# Patient Record
Sex: Female | Born: 1990 | Race: White | Hispanic: No | Marital: Single | Smoking: Current every day smoker
Health system: Southern US, Community
[De-identification: ages and names within clinical notes are randomized; demographics above are authoritative.]

## PROBLEM LIST (undated history)

## (undated) DIAGNOSIS — L732 Hidradenitis suppurativa: Secondary | ICD-10-CM

## (undated) DIAGNOSIS — T7840XA Allergy, unspecified, initial encounter: Secondary | ICD-10-CM

## (undated) HISTORY — DX: Allergy, unspecified, initial encounter: T78.40XA

## (undated) HISTORY — DX: Hidradenitis suppurativa: L73.2

---
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
## 2012-01-22 ENCOUNTER — Ambulatory Visit (INDEPENDENT_AMBULATORY_CARE_PROVIDER_SITE_OTHER): Payer: BC Managed Care – PPO | Admitting: Family Medicine

## 2012-01-22 VITALS — BP 91/62 | HR 114 | Temp 98.0°F | Resp 20 | Ht 66.5 in | Wt 137.4 lb

## 2012-01-22 DIAGNOSIS — R509 Fever, unspecified: Secondary | ICD-10-CM

## 2012-01-22 DIAGNOSIS — J029 Acute pharyngitis, unspecified: Secondary | ICD-10-CM

## 2012-01-22 DIAGNOSIS — R6889 Other general symptoms and signs: Secondary | ICD-10-CM

## 2012-01-22 DIAGNOSIS — J111 Influenza due to unidentified influenza virus with other respiratory manifestations: Secondary | ICD-10-CM

## 2012-01-22 LAB — POCT RAPID STREP A (OFFICE): Rapid Strep A Screen: NEGATIVE

## 2012-01-22 LAB — POCT INFLUENZA A/B
Influenza A, POC: NEGATIVE
Influenza B, POC: NEGATIVE

## 2012-01-22 MED ORDER — AMOXICILLIN 875 MG PO TABS: 875.0000 mg | ORAL_TABLET | Freq: Two times a day (BID) | ORAL | Status: DC

## 2012-01-22 NOTE — Progress Notes (Signed)
[facility]:  Office Visit  Chief Complaint:  Chief Complaint  Patient presents with  . Generalized Body Aches    3 days  . Headache  . Sore Throat  . Fever  . Cough    HPI: Jacqueline Kelly is a 21 y.o. female who complains of fevers Tmax 103 2 days ago, higherst yesterday 102.7 with msk aches, sore throat, cough, denies ear or facial pain, nausea and vomiting. + loose stools x 3 .  Friends have been sick. Worked at [employer]. No new travels, new foods, new medications.  Past Medical History  Diagnosis Date  . Allergy    History reviewed. No pertinent past surgical history. History   Social History  . Marital Status: Single    Spouse Name: N/A    Number of Children: N/A  . Years of Education: N/A   Social History Main Topics  . Smoking status: Never Smoker   . Smokeless tobacco: None  . Alcohol Use: No  . Drug Use: No  . Sexually Active: Yes    Birth Control/ Protection: None   Other Topics Concern  . None   Social History Narrative  . None   Family History  Problem Relation Age of Onset  . Heart disease Paternal Grandmother   . Cancer Paternal Grandfather    No Known Allergies Prior to Admission medications   Medication Sig Start Date End Date Taking? Authorizing Provider  naproxen (NAPROSYN) 500 MG tablet Take 1 tablet (500 mg total) by mouth 2 (two) times daily with a meal. As needed for pain 06/18/11 06/17/12  Celene Kras, MD     ROS: The patient denies night sweats, unintentional weight loss, chest pain, palpitations, wheezing, dyspnea on exertion,  abdominal pain, dysuria, hematuria, melena, numbness, weakness, or tingling.  All other systems have been reviewed and were otherwise negative with the exception of those mentioned in the HPI and as above.    PHYSICAL EXAM: Filed Vitals:   01/22/12 0933  BP: 91/62  Pulse: 114  Temp: 98 F (36.7 C)  Resp: 20   Filed Vitals:   01/22/12 0933  Height: 5' 6.5" (1.689 m)  Weight:  137 lb 6.4 oz (62.324 kg)   Body mass index is 21.84 kg/(m^2).  General: Alert, no acute distress HEENT:  Normocephalic, atraumatic, oropharynx patent. TM nl. + 3 left tonsil, no exudates. + erythema Cardiovascular:  Regular rate and rhythm, no rubs murmurs or gallops.  No Carotid bruits, radial pulse intact. No pedal edema.  Respiratory: Clear to auscultation bilaterally.  No wheezes, rales, or rhonchi.  No cyanosis, no use of accessory musculature GI: No organomegaly, abdomen is soft and non-tender, positive bowel sounds.  No masses. Skin: No rashes. Neurologic: Facial musculature symmetric. Psychiatric: Patient is appropriate throughout our interaction. Lymphatic: No cervical lymphadenopathy Musculoskeletal: Gait intact.   LABS: Results for orders placed in visit on 01/22/12  POCT RAPID STREP A (OFFICE)      Component Value Range   Rapid Strep A Screen Negative  Negative  POCT INFLUENZA A/B      Component Value Range   Influenza A, POC Negative     Influenza B, POC Negative       EKG/XRAY:   Primary read interpreted by Dr. Conley Rolls at [facility].   ASSESSMENT/PLAN: Encounter Diagnoses  Name Primary?  . Fever Yes  . Flu-like symptoms   . Pharyngitis    Bacterial vs viral URI, she has large red tonsils and  fever with cough I will go ahead an presumptively treat for bacterial phayrngitis/tonsillitis Rx Amoxacillin 875 mg BID x 10 days Salt water gargles Throat culture pending    LE, THAO PHUONG, DO 01/22/2012 10:34 AM

## 2012-01-24 LAB — CULTURE, GROUP A STREP: Organism ID, Bacteria: NORMAL

## 2012-02-07 ENCOUNTER — Encounter: Payer: Self-pay | Admitting: Family Medicine

## 2012-05-10 ENCOUNTER — Ambulatory Visit: Payer: BC Managed Care – PPO

## 2012-05-10 ENCOUNTER — Ambulatory Visit (INDEPENDENT_AMBULATORY_CARE_PROVIDER_SITE_OTHER): Payer: BC Managed Care – PPO | Admitting: Family Medicine

## 2012-05-10 VITALS — BP 100/68 | HR 90 | Temp 98.3°F | Resp 16 | Ht 66.25 in | Wt 139.0 lb

## 2012-05-10 DIAGNOSIS — N939 Abnormal uterine and vaginal bleeding, unspecified: Secondary | ICD-10-CM

## 2012-05-10 DIAGNOSIS — N926 Irregular menstruation, unspecified: Secondary | ICD-10-CM

## 2012-05-10 DIAGNOSIS — R319 Hematuria, unspecified: Secondary | ICD-10-CM

## 2012-05-10 DIAGNOSIS — R109 Unspecified abdominal pain: Secondary | ICD-10-CM

## 2012-05-10 DIAGNOSIS — Z7251 High risk heterosexual behavior: Secondary | ICD-10-CM

## 2012-05-10 LAB — POCT URINALYSIS DIPSTICK
Bilirubin, UA: NEGATIVE
Glucose, UA: NEGATIVE
Ketones, UA: NEGATIVE
Leukocytes, UA: NEGATIVE
Nitrite, UA: NEGATIVE
Protein, UA: 100
Spec Grav, UA: 1.025
Urobilinogen, UA: 1
pH, UA: 7

## 2012-05-10 LAB — POCT UA - MICROSCOPIC ONLY
Casts, Ur, LPF, POC: NEGATIVE
Crystals, Ur, HPF, POC: NEGATIVE
Mucus, UA: POSITIVE
Yeast, UA: NEGATIVE

## 2012-05-10 LAB — HIV ANTIBODY (ROUTINE TESTING W REFLEX): HIV: NONREACTIVE

## 2012-05-10 LAB — POCT CBC
Granulocyte percent: 71.7 %G (ref 37–80)
HCT, POC: 41.4 % (ref 37.7–47.9)
Hemoglobin: 13.1 g/dL (ref 12.2–16.2)
Lymph, poc: 2.2 (ref 0.6–3.4)
MCH, POC: 28.5 pg (ref 27–31.2)
MCHC: 31.6 g/dL — AB (ref 31.8–35.4)
MCV: 90.3 fL (ref 80–97)
MID (cbc): 0.5 (ref 0–0.9)
MPV: 10.5 fL (ref 0–99.8)
POC Granulocyte: 6.9 (ref 2–6.9)
POC LYMPH PERCENT: 22.9 %L (ref 10–50)
POC MID %: 5.4 %M (ref 0–12)
Platelet Count, POC: 30.7 10*3/uL — AB (ref 142–424)
RBC: 4.59 M/uL (ref 4.04–5.48)
RDW, POC: 14.3 %
WBC: 9.6 10*3/uL (ref 4.6–10.2)

## 2012-05-10 LAB — RPR

## 2012-05-10 LAB — POCT URINE PREGNANCY: Preg Test, Ur: NEGATIVE

## 2012-05-10 MED ORDER — CIPROFLOXACIN HCL 500 MG PO TABS: 500.0000 mg | ORAL_TABLET | Freq: Two times a day (BID) | ORAL | Status: DC

## 2012-05-10 MED ORDER — HYDROCODONE-ACETAMINOPHEN 5-325 MG PO TABS: 1.0000 | ORAL_TABLET | Freq: Four times a day (QID) | ORAL | Status: DC | PRN
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
## 2012-05-10 NOTE — Patient Instructions (Addendum)
Drink plenty of fluids, as this could be from a kidney stone, but we will also treat with an antibiotic for a possible urinary tract infection. You can take the pain medicine up to every 6 hours as needed, but this may make you sleepy - do not drive while taking this.  You need to be rechecked in the next 48 hours to determine if a cat scan is needed, make sure the bleeding is not abnormal vaginal bleeding, and to check for other sexually transmitted infections. Return to the clinic or go to the nearest emergency room if any of your symptoms worsen or new symptoms occur. Your should receive a call or letter about your lab results within the next week to 10 days.

## 2012-05-12 LAB — URINE CULTURE
Colony Count: NO GROWTH
Organism ID, Bacteria: NO GROWTH

## 2012-05-20 ENCOUNTER — Encounter: Payer: Self-pay | Admitting: Radiology

## 2012-07-15 ENCOUNTER — Ambulatory Visit (INDEPENDENT_AMBULATORY_CARE_PROVIDER_SITE_OTHER): Payer: BC Managed Care – PPO | Admitting: Internal Medicine

## 2012-07-15 VITALS — BP 130/90 | HR 125 | Temp 98.1°F | Resp 18 | Ht 66.0 in | Wt 135.0 lb

## 2012-07-15 DIAGNOSIS — R110 Nausea: Secondary | ICD-10-CM

## 2012-07-15 DIAGNOSIS — N946 Dysmenorrhea, unspecified: Secondary | ICD-10-CM

## 2012-07-15 DIAGNOSIS — N644 Mastodynia: Secondary | ICD-10-CM

## 2012-07-15 DIAGNOSIS — Z3009 Encounter for other general counseling and advice on contraception: Secondary | ICD-10-CM

## 2012-07-15 DIAGNOSIS — N912 Amenorrhea, unspecified: Secondary | ICD-10-CM

## 2012-07-15 DIAGNOSIS — R079 Chest pain, unspecified: Secondary | ICD-10-CM

## 2012-07-15 DIAGNOSIS — N390 Urinary tract infection, site not specified: Secondary | ICD-10-CM

## 2012-07-15 LAB — POCT CBC
Granulocyte percent: 67.2 %G (ref 37–80)
HCT, POC: 38.6 % (ref 37.7–47.9)
Hemoglobin: 12.1 g/dL — AB (ref 12.2–16.2)
Lymph, poc: 2.6 (ref 0.6–3.4)
MCH, POC: 27.8 pg (ref 27–31.2)
MCHC: 31.3 g/dL — AB (ref 31.8–35.4)
MCV: 88.6 fL (ref 80–97)
MID (cbc): 0.6 (ref 0–0.9)
MPV: 8.3 fL (ref 0–99.8)
POC Granulocyte: 6.5 (ref 2–6.9)
POC LYMPH PERCENT: 27.1 %L (ref 10–50)
POC MID %: 5.7 %M (ref 0–12)
Platelet Count, POC: 324 10*3/uL (ref 142–424)
RBC: 4.36 M/uL (ref 4.04–5.48)
RDW, POC: 14.3 %
WBC: 9.7 10*3/uL (ref 4.6–10.2)

## 2012-07-15 LAB — POCT UA - MICROSCOPIC ONLY
Amorphous: POSITIVE
Casts, Ur, LPF, POC: NEGATIVE
Crystals, Ur, HPF, POC: NEGATIVE
Mucus, UA: POSITIVE
Yeast, UA: NEGATIVE

## 2012-07-15 LAB — POCT URINALYSIS DIPSTICK
Bilirubin, UA: NEGATIVE
Glucose, UA: NEGATIVE
Ketones, UA: NEGATIVE
Nitrite, UA: NEGATIVE
Protein, UA: >=300
Spec Grav, UA: >=1.03
Urobilinogen, UA: 0.2
pH, UA: 7

## 2012-07-15 LAB — POCT URINE PREGNANCY: Preg Test, Ur: NEGATIVE

## 2012-07-15 MED ORDER — AMOXICILLIN 500 MG PO CAPS: 1000.0000 mg | ORAL_CAPSULE | Freq: Two times a day (BID) | ORAL | Status: DC

## 2012-07-15 MED ORDER — PRENATAL 27-0.8 MG PO TABS: 1.0000 | ORAL_TABLET | Freq: Every day | ORAL | Status: DC

## 2012-07-15 NOTE — Patient Instructions (Addendum)
Pregnancy If you are planning on getting pregnant, it is a good idea to make a preconception appointment with your caregiver to discuss having a healthy lifestyle before getting pregnant. This includes diet, weight, exercise, taking prenatal vitamins (especially folic acid, which helps prevent brain and spinal cord defects), avoiding alcohol, smoking and illegal drugs, medical problems (diabetes, convulsions), family history of genetic problems, working conditions, and immunizations. It is better to have knowledge of these things and do something about them before getting pregnant. During your pregnancy, it is important to follow certain guidelines in order to have a healthy baby. It is very important to get good prenatal care and follow your caregiver's instructions. Prenatal care includes all the medical care you receive before your baby's birth. This helps to prevent problems during the pregnancy and childbirth. HOME CARE INSTRUCTIONS   Start your prenatal visits by the 12th week of pregnancy or earlier, if possible. At first, appointments are usually scheduled monthly. They become more frequent in the last 2 months before delivery. It is important that you keep your caregiver's appointments and follow your caregiver's instructions regarding medication use, exercise, and diet.  During pregnancy, you are providing food for you and your baby. Eat a regular, well-balanced diet. Choose foods such as meat, fish, milk and other dairy products, vegetables, fruits, whole-grain breads and cereals. Your caregiver will inform you of the ideal weight gain depending on your current height and weight. Drink lots of liquids. Try to drink 8 glasses of water a day.  Alcohol is associated with a number of birth defects including fetal alcohol syndrome. It is best to avoid alcohol completely. Smoking will cause low birth rate and prematurity. Use of alcohol and nicotine during your pregnancy also increases the chances that  your child will be chemically dependent later in their life and may contribute to SIDS (Sudden Infant Death Syndrome).  Do not use illegal drugs.  Only take prescription or over-the-counter medications that are recommended by your caregiver. Other medications can cause genetic and physical problems in the baby.  Morning sickness can often be helped by keeping soda crackers at the bedside. Eat a few before getting up in the morning.  A sexual relationship may be continued until near the end of pregnancy if there are no other problems such as early (premature) leaking of amniotic fluid from the membranes, vaginal bleeding, painful intercourse or belly (abdominal) pain.  Exercise regularly. Check with your caregiver if you are unsure of the safety of some of your exercises.  Do not use hot tubs, steam rooms or saunas. These increase the risk of fainting and hurting yourself and the baby. Swimming is OK for exercise. Get plenty of rest, including afternoon naps when possible, especially in the third trimester.  Avoid toxic odors and chemicals.  Do not wear high heels. They may cause you to lose your balance and fall.  Do not lift over 5 pounds. If you do lift anything, lift with your legs and thighs, not your back.  Avoid long trips, especially in the third trimester.  If you have to travel out of the city or state, take a copy of your medical records with you. SEEK IMMEDIATE MEDICAL CARE IF:   You develop an unexplained oral temperature above 102 F (38.9 C), or as your caregiver suggests.  You have leaking of fluid from the vagina. If leaking membranes are suspected, take your temperature and inform your caregiver of this when you call.  There is vaginal spotting   or bleeding. Notify your caregiver of the amount and how many pads are used.  You continue to feel sick to your stomach (nauseous) and have no relief from remedies suggested, or you throw up (vomit) blood or coffee ground like  materials.  You develop upper abdominal pain.  You have round ligament discomfort in the lower abdominal area. This still must be evaluated by your caregiver.  You feel contractions of the uterus.  You do not feel the baby move, or there is less movement than before.  You have painful urination.  You have abnormal vaginal discharge.  You have persistent diarrhea.  You get a severe headache.  You have problems with your vision.  You develop muscle weakness.  You feel dizzy and faint.  You develop shortness of breath.  You develop chest pain.  You have back pain that travels down to your leg and feet.  You feel irregular or a very fast heartbeat.  You develop excessive weight gain in a short period of time (5 pounds in 3 to 5 days).  You are involved in a domestic violence situation. Document Released: 02/28/2005 Document Revised: 08/30/2011 Document Reviewed: 08/22/2008 [employer] [facility] [facility] Patient Information 2013 [place], [place]. Urinary Tract Infection in Pregnancy A urinary tract infection (UTI) is a bacterial infection of the urinary tract. Infection of the urinary tract can include the ureters, kidneys (pyelonephritis), bladder (cystitis), and urethra (urethritis). All pregnant women should be screened for bacteria in the urinary tract. Identifying and treating a UTI will decrease the risk of preterm labor and developing more serious infections in both the mother and baby. CAUSES Bacteria germs cause almost all UTIs. There are many factors that can increase your chances of getting a UTI during pregnancy. These include:  Having a short urethra.  Poor toilet and hygiene habits.  Sexual intercourse.  Blockage of urine along the urinary tract.  Problems with the pelvic muscles or nerves.  Diabetes.  Obesity.  Bladder problems after having several children.  Previous history of UTI. SYMPTOMS   Pain, burning, or a stinging feeling when urinating.  Suddenly feeling  the need to urinate right away (urgency).  Loss of bladder control (urinary incontinence).  Frequent urination, more than is common with pregnancy.  Lower abdominal or back discomfort.  Bad smelling urine.  Cloudy urine.  Blood in the urine (hematuria).  Fever. When the kidneys are infected, the symptoms may be:  Back pain.  Flank pain on the right side more so than the left.  Fever.  Chills.  Nausea.  Vomiting. DIAGNOSIS   Urine tests.  Additional tests and procedures may include:  Ultrasound of the kidneys, ureters, bladder, and urethra.  Looking in the bladder with a lighted tube (cystoscopy).  Certain X-ray studies only when absolutely necessary. Finding out the results of your test Ask when your test results will be ready. Make sure you get your test results. TREATMENT  Antibiotic medicine by mouth.  Antibiotics given through the vein (intravenously), if needed. HOME CARE INSTRUCTIONS   Take your antibiotics as directed. Finish them even if you start to feel better. Only take medicine as directed by your caregiver.  Drink enough fluids to keep your urine clear or pale yellow.  Do not have sexual intercourse until the infection is gone and your caregiver says it is okay.  Make sure you are tested for UTIs throughout your pregnancy if you get one. These infections often come back. Preventing a UTI in the future:  Practice good toilet habits.  Always wipe from front to back. Use the tissue only once.  Do not hold your urine. Empty your bladder as soon as possible when the urge comes.  Do not douche or use deodorant sprays.  Wash with soap and warm water around the genital area and the anus.  Empty your bladder before and after sexual intercourse.  Wear underwear with a cotton crotch.  Avoid caffeine and carbonated drinks. They can irritate the bladder.  Drink cranberry juice or take cranberry pills. This may decrease the risk of getting a  UTI.  Do not drink alcohol.  Keep all your appointments and tests as scheduled. SEEK MEDICAL CARE IF:   Your symptoms get worse.  You are still having fevers 2 or more days after treatment begins.  You develop a rash.  You feel that you are having problems with medicines prescribed.  You develop abnormal vaginal discharge. SEEK IMMEDIATE MEDICAL CARE IF:   You develop back or flank pain.  You develop chills.  You have blood in your urine.  You develop nausea and vomiting.  You develop contractions of your uterus.  You have a gush of fluid from the vagina. MAKE SURE YOU:   Understand these instructions.  Will watch your condition.  Will get help right away if you are not doing well or get worse. Document Released: 06/25/2010 Document Revised: 05/23/2011 Document Reviewed: 06/25/2010 [facility] Patient Information 2013 [place], [place].

## 2012-07-15 NOTE — Progress Notes (Signed)
Subjective:    Patient ID: Jacqueline Kelly, female    DOB: [birth year], 22 y.o.   MRN: [id number]  HPI Has had sxs of pregnancy over 2 weeks, nausea in am, weight gain, incr breast size and tenderness, amenorrhea since February. Has many neg pregnancy tests including today. Also foe 3 days has very short spells of shooting pain in right lower lateral ribs with no pain or pont tenderness between spells.No sob, cough, blood.No pain after eating.   Review of Systems     Objective:   Physical Exam  Vitals reviewed. Constitutional: She is oriented to person, place, and time. She appears well-developed and well-nourished.  Eyes: EOM are normal. Pupils are equal, round, and reactive to light. No scleral icterus.  Neck: Normal range of motion.  Cardiovascular: Normal rate, regular rhythm and normal heart sounds.   Pulmonary/Chest: Effort normal and breath sounds normal.  Abdominal: Soft. Bowel sounds are normal. She exhibits no distension and no mass. There is no tenderness. There is no guarding.  Musculoskeletal: Normal range of motion.  Neurological: She is alert and oriented to person, place, and time. She exhibits normal muscle tone. Coordination normal.  Psychiatric: She has a normal mood and affect.   Appears well. Results for orders placed in visit on 07/15/12  POCT URINE PREGNANCY      Result Value Range   Preg Test, Ur Negative     Results for orders placed in visit on 07/15/12  POCT URINE PREGNANCY      Result Value Range   Preg Test, Ur Negative    POCT URINALYSIS DIPSTICK      Result Value Range   Color, UA yellow     Clarity, UA cloudy     Glucose, UA neg     Bilirubin, UA neg     Ketones, UA neg     Spec Grav, UA >=1.030     Blood, UA large     pH, UA 7.0     Protein, UA >=300     Urobilinogen, UA 0.2     Nitrite, UA neg     Leukocytes, UA Trace    POCT UA - MICROSCOPIC ONLY      Result Value Range   WBC, Ur, HPF, POC 6-8     RBC, urine, microscopic 1-2     Bacteria, U Microscopic trace     Mucus, UA positive     Epithelial cells, urine per micros 2-3     Crystals, Ur, HPF, POC neg     Casts, Ur, LPF, POC neg     Yeast, UA neg     Amorphous positive    POCT CBC      Result Value Range   WBC 9.7  4.6 - 10.2 K/uL   Lymph, poc 2.6  0.6 - 3.4   POC LYMPH PERCENT 27.1  10 - 50 %L   MID (cbc) 0.6  0 - 0.9   POC MID % 5.7  0 - 12 %M   POC Granulocyte 6.5  2 - 6.9   Granulocyte percent 67.2  37 - 80 %G   RBC 4.36  4.04 - 5.48 M/uL   Hemoglobin 12.1 (*) 12.2 - 16.2 g/dL   HCT, POC 47.4  25.9 - 47.9 %   MCV 88.6  80 - 97 fL   MCH, POC 27.8  27 - 31.2 pg   MCHC 31.3 (*) 31.8 - 35.4 g/dL   RDW, POC 56.3     Platelet  Count, POC 324  142 - 424 K/uL   MPV 8.3  0 - 99.8 fL         Assessment & Plan:  Assume you are pregnant Prenatal vitamins Tylenol for pain Stop and do not use cipro Amoxil for uti

## 2012-07-18 ENCOUNTER — Telehealth: Payer: Self-pay

## 2012-07-18 NOTE — Telephone Encounter (Signed)
Labs were reviewed in office, unclear why she is calling, she was asking about her blood test, reviewed this with her, encouraged her to use prenatal vits in case of pregnancy

## 2012-07-18 NOTE — Telephone Encounter (Signed)
PATIENT CALLING TO GET LAB RESULTS. [phone]

## 2012-08-02 ENCOUNTER — Other Ambulatory Visit: Payer: Self-pay | Admitting: Obstetrics and Gynecology

## 2012-08-24 ENCOUNTER — Emergency Department (HOSPITAL_COMMUNITY)
Admission: EM | Admit: 2012-08-24 | Discharge: 2012-08-24 | Disposition: A | Discharge status: Home / Self Care | Payer: Self-pay | Attending: Emergency Medicine | Admitting: Emergency Medicine

## 2012-08-24 ENCOUNTER — Encounter (HOSPITAL_COMMUNITY): Payer: Self-pay | Admitting: Emergency Medicine

## 2012-08-24 ENCOUNTER — Emergency Department (HOSPITAL_COMMUNITY): Payer: Self-pay

## 2012-08-24 DIAGNOSIS — Y9389 Activity, other specified: Secondary | ICD-10-CM | POA: Insufficient documentation

## 2012-08-24 DIAGNOSIS — Y929 Unspecified place or not applicable: Secondary | ICD-10-CM | POA: Insufficient documentation

## 2012-08-24 DIAGNOSIS — S39012A Strain of muscle, fascia and tendon of lower back, initial encounter: Secondary | ICD-10-CM

## 2012-08-24 DIAGNOSIS — S8990XA Unspecified injury of unspecified lower leg, initial encounter: Secondary | ICD-10-CM | POA: Insufficient documentation

## 2012-08-24 DIAGNOSIS — S335XXA Sprain of ligaments of lumbar spine, initial encounter: Secondary | ICD-10-CM | POA: Insufficient documentation

## 2012-08-24 DIAGNOSIS — W1809XA Striking against other object with subsequent fall, initial encounter: Secondary | ICD-10-CM | POA: Insufficient documentation

## 2012-08-24 DIAGNOSIS — S99929A Unspecified injury of unspecified foot, initial encounter: Secondary | ICD-10-CM | POA: Insufficient documentation

## 2012-08-24 DIAGNOSIS — R209 Unspecified disturbances of skin sensation: Secondary | ICD-10-CM | POA: Insufficient documentation

## 2012-08-24 DIAGNOSIS — S99919A Unspecified injury of unspecified ankle, initial encounter: Secondary | ICD-10-CM | POA: Insufficient documentation

## 2012-08-24 DIAGNOSIS — S0993XA Unspecified injury of face, initial encounter: Secondary | ICD-10-CM | POA: Insufficient documentation

## 2012-08-24 DIAGNOSIS — M7918 Myalgia, other site: Secondary | ICD-10-CM

## 2012-08-24 MED ORDER — NAPROXEN 500 MG PO TABS: 500.0000 mg | ORAL_TABLET | Freq: Two times a day (BID) | ORAL | Status: DC

## 2012-08-24 MED ORDER — CYCLOBENZAPRINE HCL 10 MG PO TABS: 10.0000 mg | ORAL_TABLET | Freq: Two times a day (BID) | ORAL | Status: DC | PRN

## 2012-08-24 NOTE — ED Provider Notes (Signed)
History    This chart was scribed for Raymon Mutton, non-physician practitioner working with Vida Roller, MD by Leone Payor, ED Scribe. This patient was seen in room WTR5/WTR5 and the patient's care was started at 1450.   CSN: [id number]  Arrival date & time 08/24/12  1450   First MD Initiated Contact with Patient 08/24/12 1507      Chief Complaint  Patient presents with  . Back Pain    fell against counter onto l/side  . Leg Pain     The history is provided by the patient. No language interpreter was used.    HPI Comments: Jacqueline Kelly is a 22 y.o. female who presents to the Emergency Department complaining of sudden, constant, unchanged, L sided lower back pain that radiates up and down to L leg starting yesterday. Pt states she fell against the edge of a countertop. She denies head injury or LOC. She describes the pain as sharp and shooting. She also has some associated neck pain and occasional tingling sensations in her L leg. She denies having h/o of back pain. She denies taking any OTC pain medications. The pain does not radiate to upper extremities. She denies numbness currently, dizziness, nausea, vomiting, weakness, visual disturbances, neck stiffness, change in bowel or bladder function. Pt denies smoking and alcohol use.   LMP 3 days ago - as per patient.  Past Medical History  Diagnosis Date  . Allergy     History reviewed. No pertinent past surgical history.  Family History  Problem Relation Age of Onset  . Heart disease Paternal Grandmother   . Cancer Paternal Grandfather     History  Substance Use Topics  . Smoking status: Never Smoker   . Smokeless tobacco: Never Used  . Alcohol Use: No    OB History   Grav Para Term Preterm Abortions TAB SAB Ect Mult Living                  Review of Systems  HENT: Positive for neck pain. Negative for trouble swallowing and neck stiffness.   Eyes: Negative for visual disturbance.  Respiratory: Negative for  cough and shortness of breath.   Cardiovascular: Negative for chest pain.  Gastrointestinal: Negative for nausea and vomiting.  Neurological: Negative for dizziness, weakness and numbness.  All other systems reviewed and are negative.    Allergies  Review of patient's allergies indicates no known allergies.  Home Medications   Current Outpatient Rx  Name  Route  Sig  Dispense  Refill  . cyclobenzaprine (FLEXERIL) 10 MG tablet   Oral   Take 1 tablet (10 mg total) by mouth 2 (two) times daily as needed for muscle spasms.   20 tablet   0   . naproxen (NAPROSYN) 500 MG tablet   Oral   Take 1 tablet (500 mg total) by mouth 2 (two) times daily.   20 tablet   0     BP 116/76  Pulse 92  Temp(Src) 97.7 F (36.5 C) (Oral)  Wt 155 lb (70.308 kg)  BMI 25.03 kg/m2  SpO2 98%  LMP 08/16/2012  Physical Exam  Nursing note and vitals reviewed. Constitutional: She is oriented to person, place, and time. She appears well-developed and well-nourished. No distress.  HENT:  Head: Normocephalic and atraumatic.  Eyes: Conjunctivae and EOM are normal. Pupils are equal, round, and reactive to light.  Neck: Normal range of motion. Neck supple.  Mild discomfort upon palpation to posterior aspect of neck.  Cardiovascular: Normal rate, regular rhythm and normal heart sounds.   Pulses:      Radial pulses are 2+ on the right side, and 2+ on the left side.       Dorsalis pedis pulses are 2+ on the right side, and 2+ on the left side.  Pulmonary/Chest: Effort normal and breath sounds normal. No respiratory distress. She has no wheezes. She has no rales.  Musculoskeletal: Normal range of motion.       Lumbar back: She exhibits tenderness. She exhibits normal range of motion, no bony tenderness, no swelling, no edema, no deformity and no laceration.       Back:  Full ROM to upper and lower extremities bilaterally. Strength 5+/5+ to upper and lower extremities bilaterally.   Full flexion of back.  Pain upon palpation to lumbosacral region predominately to the left.   Lymphadenopathy:    She has no cervical adenopathy.  Neurological: She is alert and oriented to person, place, and time.  Cranial nerves III-XII grossly intact. Sensation intact to upper and lower extremities bilateraly with differentiation to sharp and dull touch.  Gait proper, proper balance  Skin: Skin is warm and dry.  Negative ecchymosis or discoloration noted to the back.  Scattered, healing bruises to legs bilaterally.   Psychiatric: She has a normal mood and affect. Her behavior is normal.    ED Course  Procedures (including critical care time)  DIAGNOSTIC STUDIES: Oxygen Saturation is 98% on RA, normal by my interpretation.    COORDINATION OF CARE: 3:54 PM Discussed treatment plan with pt at bedside and pt agreed to plan.   Labs Reviewed - No data to display Dg Cervical Spine Complete  08/24/2012   The *RADIOLOGY REPORT*  Clinical Data: Fall, pain  CERVICAL SPINE - 4+ VIEWS  Comparison:  06/18/2011.  Findings:  There is no evidence of cervical spine fracture or prevertebral soft tissue swelling.  Alignment is normal.  No other significant bone abnormalities are identified.No significant change from priors.  IMPRESSION: Negative cervical spine radiographs.   Original Report Authenticated By: Davonna Belling, M.D.   Dg Lumbar Spine Complete  08/24/2012   *RADIOLOGY REPORT*  Clinical Data: Fall, back pain  LUMBAR SPINE - COMPLETE 4+ VIEW  Comparison: 06/18/2011  Findings: Normal alignment.  Preserved vertebral body heights and disc spaces.  No compression fracture, wedge shaped deformity or focal kyphosis.  No pars defects.  Normal pedicles and SI joints. Nonobstructive bowel gas pattern.  IMPRESSION: Stable exam.  No acute finding   Original Report Authenticated By: Judie Petit. Shick, M.D.     1. Lumbar strain, initial encounter   2. Musculoskeletal pain       MDM  I personally performed the services described in  this documentation, which was scribed in my presence. The recorded information has been reviewed and is accurate.  Patient stable, afebrile. Full ROM to upper and lower extremities. Gait proper, proper balance. Negative neurological deficits. Suspicion for musculoskeletal injury with possible bone bruise secondary to trauma with fall yesterday into counter. Negative findings on imaging. Patient to be discharged. Discharged patient with anti-inflammatories and muscle relaxer- discussed course and precautions with patient. Discussed with patient to rest, no strenuous activity. Referred patient to orthopedics. Discussed with patient to apply icy-hot and warm compression to aid in muscle relief. Discussed with patient to monitor symptoms and if symptoms are to worsen or change to report back to the ED - strict return instructions given. Patient agreed to plan of care, understood,  all questions answered.      Raymon Mutton, PA-C 08/25/12 [phone]

## 2012-08-24 NOTE — ED Notes (Signed)
Pt c/o low back pain and l/leg pain. Pt and friend stated that she fell against the edge of a counter top

## 2012-08-24 NOTE — Progress Notes (Signed)
[facility] has seen patient and provided her with a list of primary care resources. °

## 2012-08-25 NOTE — ED Provider Notes (Signed)
Medical screening examination/treatment/procedure(s) were performed by non-physician practitioner and as supervising physician I was immediately available for consultation/collaboration.    Vida Roller, MD 08/25/12 1007

## 2012-08-31 ENCOUNTER — Ambulatory Visit (INDEPENDENT_AMBULATORY_CARE_PROVIDER_SITE_OTHER): Payer: BC Managed Care – PPO | Admitting: Family Medicine

## 2012-08-31 ENCOUNTER — Encounter: Payer: Self-pay | Admitting: Family Medicine

## 2012-08-31 VITALS — BP 100/62 | HR 87 | Temp 98.1°F | Resp 16 | Ht 66.0 in | Wt 130.4 lb

## 2012-08-31 DIAGNOSIS — R300 Dysuria: Secondary | ICD-10-CM

## 2012-08-31 DIAGNOSIS — F39 Unspecified mood [affective] disorder: Secondary | ICD-10-CM

## 2012-08-31 DIAGNOSIS — F909 Attention-deficit hyperactivity disorder, unspecified type: Secondary | ICD-10-CM | POA: Insufficient documentation

## 2012-08-31 DIAGNOSIS — O210 Mild hyperemesis gravidarum: Secondary | ICD-10-CM

## 2012-08-31 LAB — POCT URINALYSIS DIPSTICK
Bilirubin, UA: NEGATIVE
Glucose, UA: NEGATIVE
Ketones, UA: NEGATIVE
Nitrite, UA: NEGATIVE
Protein, UA: 30
Spec Grav, UA: >=1.03
Urobilinogen, UA: 0.2
pH, UA: 6

## 2012-08-31 LAB — POCT URINE PREGNANCY: Preg Test, Ur: NEGATIVE

## 2012-08-31 MED ORDER — AMPHETAMINE-DEXTROAMPHET ER 15 MG PO CP24: 15.0000 mg | ORAL_CAPSULE | ORAL | Status: DC

## 2012-08-31 NOTE — Patient Instructions (Signed)
You have ADHD and I have prescribed a low dose medication for you to take. Take 1 capsule every morning with a small snack; taking it on an empty stomach may cause you to have more nausea.  Get your medication for Chlamydia and take it as prescribed; again, take it with a small snack unless the instructions say to take it on an empty stomach.  Have your boyfriend contact the [facility] and find out if they have the STD CLINIC; if so, he can get his STD treated there. DO not have sex until this is cleared up and use CONDOMS when you have sex (to prevent pregnancy and repeat STDs).  The questionnaire that you completed suggest that yu have a MOOD DISORDER. It would be best to refer you to a PSYCHIATRIST for further evaluation. Check with your mother to see if the insurance covers mental health providers. We can discuss this more when you come back.

## 2012-09-04 DIAGNOSIS — F39 Unspecified mood [affective] disorder: Secondary | ICD-10-CM | POA: Insufficient documentation

## 2012-09-04 NOTE — Progress Notes (Signed)
S:  This 22  y.o. Cauc female presents for evaluation for ADHD; her mother and boyfriend suspect she has this disorder. Her younger brother has ADHD and it "runs in the family". She reports struggling through H.S., making mostly Cs. SHe is now job hunting but has also considered going to school at [employer]. Pt was fire from last job at [employer] because of missing work due to GI virus. Her supervisor would not accept her excuse for absence. Pt's mother has told her that her "mood is off"; mental health disorders are prevalent on paternal side of family. She reports difficulty sleeping due to racing thoughts. Pt denies excessive use of OTC medications, alcohol intake or drug use.  Pt completed self-Report for Adult ADHD with all answers being in the shaded grey areas- significantly + for ADHD. She also completed the "Mood Disorder" questionnaire with 11/14 positive responses.  Pt c/o AM nausea w/ occasional vomiting. She also c/o dysuria. Pt has an STD but has not picked up the medication yet. Her boyfriend was given a RX for medication which he has lost. Pt has engaged in unprotected sex w/ boyfriend since last period. She is not on any contraception.   PMHx, Soc Hx and Fam Hx reviewed.  ROS: Negative for activity or appetite change, diaphoresis, fatigue, CP or palpitations, SOB or DOE, GI problems, myalgias/arthralgias, HA, dizziness, weakness, tremors, confusion, dysphoric mood, halluciantions, behavior problems or agitations. She has no evidence of self harm, suicide ideation or HI.  O: Filed Vitals:   08/31/12 1027  BP: 100/62  Pulse: 87  Temp: 98.1 F (36.7 C)  Resp: 16   GEN: in NAD; slender habitus. HEENT: [place]/AT. Eyelids normal. PERRLA and EOMI w/ clear conj/sclerae. EACs/TMs normal. Oroph and nose normal. Good dentition. NECK: Supple w/ FROM. No LAN or TMG. COR: RRR. No m/g/r.  LUNGS: CTA; normal resp rate and effort. SKIN: W&D; no rashes, erythema or pallor. ABD: Normal appearance.  Flat and soft, NT w/o guarding. No masses or HSM. MS: MAEs; no c/c/e. NEURO: A&O x 3; CNs intact. Nonfocal.  PSYCH: Pleasant calm demeanor. Occasionally inattentive. Speech pattern and thought content normal but pt was tangential at times during the evaluation. Judgement normal.   A/P: ADHD (attention deficit hyperactivity disorder)- Trial Adderall XR 15 mg capsule 1 cap daily.  Unspecified episodic mood disorder- pt to check with mother to find out if mental health evaluation is covered by her insurer. If so, we will discuss referral to psychiatrist for thorough assessment.  Morning sickness - Plan: POCT urine pregnancy- negative. Advised pt to use condoms. Pt understands.  Dysuria - Plan: POCT urinalysis dipstick; symptom most likely due to untreated STD. Pt understands.  Meds ordered this encounter  Medications  . amphetamine-dextroamphetamine (ADDERALL XR) 15 MG 24 hr capsule    Sig: Take 1 capsule (15 mg total) by mouth every morning.    Dispense:  35 capsule    Refill:  0

## 2012-09-28 ENCOUNTER — Ambulatory Visit: Payer: BC Managed Care – PPO | Admitting: Family Medicine

## 2012-11-28 ENCOUNTER — Telehealth: Payer: Self-pay

## 2012-11-28 NOTE — Telephone Encounter (Signed)
Patient advised, she is transferred to make an appt.

## 2012-11-28 NOTE — Telephone Encounter (Signed)
Patient needs a refill on Adderall. States she has been out of medication for a month.  [phone]

## 2012-11-28 NOTE — Telephone Encounter (Signed)
Adderall was prescribed on a "trial" basis in June. Pt was supposed to find out if her parent's insurance covered psychiatric referral. I felt like she needed a full evaluation because I suspected some type of mood disorder. Pt has not called back with that information nor made a follow-up appointment. She will need to be seen to discuss this.

## 2012-11-30 ENCOUNTER — Encounter: Payer: BC Managed Care – PPO | Admitting: Family Medicine

## 2012-11-30 NOTE — Progress Notes (Signed)
This encounter was created in error - please disregard.

## 2012-12-09 ENCOUNTER — Encounter (HOSPITAL_COMMUNITY): Payer: Self-pay | Admitting: *Deleted

## 2012-12-09 ENCOUNTER — Emergency Department (HOSPITAL_COMMUNITY)
Admission: EM | Admit: 2012-12-09 | Discharge: 2012-12-09 | Disposition: A | Discharge status: Home / Self Care | Payer: BC Managed Care – PPO | Attending: Emergency Medicine | Admitting: Emergency Medicine

## 2012-12-09 ENCOUNTER — Emergency Department (HOSPITAL_COMMUNITY): Payer: BC Managed Care – PPO

## 2012-12-09 DIAGNOSIS — G5621 Lesion of ulnar nerve, right upper limb: Secondary | ICD-10-CM

## 2012-12-09 DIAGNOSIS — R209 Unspecified disturbances of skin sensation: Secondary | ICD-10-CM | POA: Insufficient documentation

## 2012-12-09 DIAGNOSIS — G5620 Lesion of ulnar nerve, unspecified upper limb: Secondary | ICD-10-CM | POA: Insufficient documentation

## 2012-12-09 DIAGNOSIS — Y9289 Other specified places as the place of occurrence of the external cause: Secondary | ICD-10-CM | POA: Insufficient documentation

## 2012-12-09 DIAGNOSIS — Y9389 Activity, other specified: Secondary | ICD-10-CM | POA: Insufficient documentation

## 2012-12-09 DIAGNOSIS — S60219A Contusion of unspecified wrist, initial encounter: Secondary | ICD-10-CM | POA: Insufficient documentation

## 2012-12-09 DIAGNOSIS — W2209XA Striking against other stationary object, initial encounter: Secondary | ICD-10-CM | POA: Insufficient documentation

## 2012-12-09 DIAGNOSIS — S60211A Contusion of right wrist, initial encounter: Secondary | ICD-10-CM

## 2012-12-09 DIAGNOSIS — Z79899 Other long term (current) drug therapy: Secondary | ICD-10-CM | POA: Insufficient documentation

## 2012-12-09 DIAGNOSIS — F17200 Nicotine dependence, unspecified, uncomplicated: Secondary | ICD-10-CM | POA: Insufficient documentation

## 2012-12-09 MED ORDER — IBUPROFEN 800 MG PO TABS: 800.0000 mg | ORAL_TABLET | Freq: Three times a day (TID) | ORAL | Status: DC | PRN

## 2012-12-09 NOTE — ED Notes (Signed)
Patient sitting on stretcher, Alert in NAD. Patient not supporting wrist or making any motions of protecting the limb. Visitor at the bedside. Will con't to monitor.

## 2012-12-09 NOTE — ED Provider Notes (Signed)
CSN: [id number]     Arrival date & time 12/09/12  0148 History   First MD Initiated Contact with Patient 12/09/12 0231     Chief Complaint  Patient presents with  . Wrist Injury   HPI  History provided by the patient. Patient is a 22 year old female with no significant PMH presenting with complaints of right wrist pain and numbness. 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 Patient was doing this when she suddenly reports hitting the casket the heart position with the handcuff causing sharp pains to her wrist area. Since that time she has had a tingling and numbness sensation to her fingers especially the third through fifth fingers. Pain is worse with movements at the wrist or with movements of the fingers. She has not used any treatment for her symptoms. She denies any significant swelling. No other aggravating or alleviating factors. No other associated symptoms.    Past Medical History  Diagnosis Date  . Allergy    History reviewed. No pertinent past surgical history. Family History  Problem Relation Age of Onset  . Heart disease Paternal Grandmother   . Cancer Paternal Grandfather    History  Substance Use Topics  . Smoking status: Current Every Day Smoker  . Smokeless tobacco: Never Used  . Alcohol Use: Yes   OB History   Grav Para Term Preterm Abortions TAB SAB Ect Mult Living                 Review of Systems  Neurological: Positive for numbness.  All other systems reviewed and are negative.    Allergies  Review of patient's allergies indicates no known allergies.  Home Medications   Current Outpatient Rx  Name  Route  Sig  Dispense  Refill  . amphetamine-dextroamphetamine (ADDERALL XR) 15 MG 24 hr capsule   Oral   Take 1 capsule (15 mg total) by mouth every morning.   35 capsule   0   . ibuprofen (ADVIL,MOTRIN) 200 MG tablet    Oral   Take 400 mg by mouth every 6 (six) hours as needed for pain.          BP 134/79  Pulse 79  Temp(Src) 97.9 F (36.6 C) (Oral)  Resp 18  SpO2 100%  LMP 11/25/2012 Physical Exam  Nursing note and vitals reviewed. Constitutional: She is oriented to person, place, and time. She appears well-developed and well-nourished. No distress.  HENT:  Head: Normocephalic.  Cardiovascular: Normal rate and regular rhythm.   No murmur heard. Pulmonary/Chest: Effort normal and breath sounds normal. No respiratory distress. She has no wheezes. She has no rales.  Musculoskeletal: She exhibits tenderness. She exhibits no edema.  A few red marks to the anterior right wrist consistent with handcuffs and contusion. There is tenderness across the entire anterior wrist especially over the distal ulna area. Patient is able to make a full closed fist and extend all fingers normally. She does report slight decrease sensations to the fingertips with light touch. Capillary refill less than 2 seconds throughout. No gross deformities. No significant swelling or hematoma. Normal pulses.  Neurological: She is alert and oriented to person, place, and time.  Skin: Skin is warm and dry. No rash noted.  Psychiatric: She has a normal mood and affect. Her behavior is normal.    ED Course  Procedures  Imaging Review Dg Wrist Complete Right  12/09/2012   CLINICAL DATA:  Wrist pain after "Hand cuff injury ".  EXAM: RIGHT WRIST - COMPLETE 3+ VIEW  COMPARISON:  None.  FINDINGS: No acute fracture or dislocation. Scaphoid intact.  IMPRESSION: No acute osseous abnormality.   Electronically Signed   By: Jeronimo Greaves   On: 12/09/2012 02:50    MDM   1. Contusion of wrist, right, initial encounter   2. Ulnar nerve compression, right      3:10 AM patient seen and evaluated. Patient appears well no acute distress. Her x-rays were reviewed without any signs of bony injury, fracture or dislocation. There is no significant  swelling to the wrist area. There is slight red marks to the anterior wrist system with contusions and wearing a hand cuff. Pulses are normal and the wrist. Normal capillary refill to all fingers less than 2 seconds. At this time we'll place patient in a wrist splint and provide orthopedic hand followup.    Angus Seller, PA-C 12/09/12 0345

## 2012-12-09 NOTE — ED Provider Notes (Signed)
Medical screening examination/treatment/procedure(s) were performed by non-physician practitioner and as supervising physician I was immediately available for consultation/collaboration.  Lacretia Tindall, MD 12/09/12 0609 

## 2012-12-09 NOTE — ED Notes (Signed)
Patient is alert and oriented x3.  She is complaining of right wrist pain from hitting it against a casket.  Currently she rates her pain 8 of 10.  She has limited movement of her right wrist.

## 2012-12-09 NOTE — ED Notes (Signed)
Patient is alert and oriented x3.  She was given DC instructions and follow up visit instructions.  Patient gave verbal understanding. She was DC ambulatory under her own power to home.  V/S stable.  He was not showing any signs of distress on DC 

## 2013-03-03 ENCOUNTER — Emergency Department (HOSPITAL_COMMUNITY)
Admission: EM | Admit: 2013-03-03 | Discharge: 2013-03-03 | Disposition: A | Discharge status: Home / Self Care | Payer: BC Managed Care – PPO | Attending: Emergency Medicine | Admitting: Emergency Medicine

## 2013-03-03 DIAGNOSIS — L732 Hidradenitis suppurativa: Secondary | ICD-10-CM

## 2013-03-03 DIAGNOSIS — F17200 Nicotine dependence, unspecified, uncomplicated: Secondary | ICD-10-CM | POA: Insufficient documentation

## 2013-03-03 MED ORDER — HYDROCODONE-ACETAMINOPHEN 5-325 MG PO TABS: 2.0000 | ORAL_TABLET | ORAL | Status: DC | PRN

## 2013-03-03 MED ORDER — SULFAMETHOXAZOLE-TMP DS 800-160 MG PO TABS: 1.0000 | ORAL_TABLET | Freq: Two times a day (BID) | ORAL | Status: DC

## 2013-03-03 NOTE — ED Provider Notes (Signed)
CSN: [id number]     Arrival date & time 03/03/13  1850 History   First MD Initiated Contact with Patient 03/03/13 1924     Chief Complaint  Patient presents with  . Abscess   (Consider location/radiation/quality/duration/timing/severity/associated sxs/prior Treatment) Patient is a 22 y.o. female presenting with abscess. The history is provided by the patient. No language interpreter was used.  Abscess Location:  Shoulder/arm and ano-genital Shoulder/arm abscess location:  L axilla and R axilla Ano-genital abscess location:  Groin Abscess quality: draining, itching, redness and warmth   Red streaking: no   Duration:  3 months Progression:  Partially resolved Chronicity:  New Context: not diabetes   Relieved by:  Nothing Worsened by:  Nothing tried Ineffective treatments:  None tried Associated symptoms: no fever   Pt complains of sores under her arm and in her groin area.  Pt has had areas drain in the past.  Past Medical History  Diagnosis Date  . Allergy    No past surgical history on file. Family History  Problem Relation Age of Onset  . Heart disease Paternal Grandmother   . Cancer Paternal Grandfather    History  Substance Use Topics  . Smoking status: Current Every Day Smoker  . Smokeless tobacco: Never Used  . Alcohol Use: Yes   OB History   Grav Para Term Preterm Abortions TAB SAB Ect Mult Living                 Review of Systems  Constitutional: Negative for fever.  Skin: Positive for color change and wound.  All other systems reviewed and are negative.    Allergies  Review of patient's allergies indicates no known allergies.  Home Medications  No current outpatient prescriptions on file. BP 120/76  Pulse 92  Temp(Src) 98 F (36.7 C) (Oral)  Resp 24  SpO2 100% Physical Exam  Nursing note and vitals reviewed. Constitutional: She appears well-developed and well-nourished.  HENT:  Head: Normocephalic.  Right Ear: External ear normal.  Left  Ear: External ear normal.  Nose: Nose normal.  Mouth/Throat: Oropharynx is clear and moist.  Eyes: Pupils are equal, round, and reactive to light.  Neck: Normal range of motion.  Cardiovascular: Normal rate.   Pulmonary/Chest: Effort normal.  Abdominal: Soft.  Genitourinary:  Scaring groin area multiple healed area  No fluctuant areas  Musculoskeletal: She exhibits tenderness.  Neurological: She is alert. She has normal reflexes.  Skin: Skin is warm.  Psychiatric: She has a normal mood and affect.    ED Course  Procedures (including critical care time) Labs Review Labs Reviewed - No data to display Imaging Review No results found.  EKG Interpretation   None       MDM  No diagnosis found. Rx for bactrim  And hydrocodone,  Follow up with primary Md for recheck next week.   Pt counseled on hidradenitis    Elson Areas, PA-C 03/03/13 1939

## 2013-03-03 NOTE — ED Provider Notes (Signed)
Medical screening examination/treatment/procedure(s) were performed by non-physician practitioner and as supervising physician I was immediately available for consultation/collaboration.  EKG Interpretation   None         William Denisa Enterline, MD 03/03/13 1954 

## 2013-03-03 NOTE — ED Notes (Signed)
The pt has lesions under her lt arm and more in her groin area for 2-3 months .  Pain is worse under her arm.  She has been seen for the same other  offices

## 2013-07-12 ENCOUNTER — Ambulatory Visit (INDEPENDENT_AMBULATORY_CARE_PROVIDER_SITE_OTHER): Payer: 59 | Admitting: Emergency Medicine

## 2013-07-12 VITALS — BP 130/80 | HR 91 | Temp 98.3°F | Resp 17 | Ht 65.5 in | Wt 143.0 lb

## 2013-07-12 DIAGNOSIS — Z0000 Encounter for general adult medical examination without abnormal findings: Secondary | ICD-10-CM

## 2013-07-12 DIAGNOSIS — L732 Hidradenitis suppurativa: Secondary | ICD-10-CM

## 2013-07-12 DIAGNOSIS — N760 Acute vaginitis: Secondary | ICD-10-CM

## 2013-07-12 DIAGNOSIS — B9689 Other specified bacterial agents as the cause of diseases classified elsewhere: Secondary | ICD-10-CM

## 2013-07-12 DIAGNOSIS — Z3042 Encounter for surveillance of injectable contraceptive: Secondary | ICD-10-CM

## 2013-07-12 DIAGNOSIS — Z3049 Encounter for surveillance of other contraceptives: Secondary | ICD-10-CM

## 2013-07-12 DIAGNOSIS — F988 Other specified behavioral and emotional disorders with onset usually occurring in childhood and adolescence: Secondary | ICD-10-CM

## 2013-07-12 DIAGNOSIS — Z202 Contact with and (suspected) exposure to infections with a predominantly sexual mode of transmission: Secondary | ICD-10-CM

## 2013-07-12 LAB — POCT WET PREP WITH KOH
KOH Prep POC: NEGATIVE
RBC Wet Prep HPF POC: NEGATIVE
Trichomonas, UA: NEGATIVE
Yeast Wet Prep HPF POC: NEGATIVE

## 2013-07-12 LAB — POCT URINE PREGNANCY: Preg Test, Ur: NEGATIVE

## 2013-07-12 LAB — POCT URINALYSIS DIPSTICK
Bilirubin, UA: NEGATIVE
Blood, UA: NEGATIVE
Glucose, UA: NEGATIVE
Ketones, UA: NEGATIVE
Nitrite, UA: NEGATIVE
Protein, UA: 300
Spec Grav, UA: >=1.03
Urobilinogen, UA: 0.2
pH, UA: 7

## 2013-07-12 LAB — POCT UA - MICROSCOPIC ONLY
Casts, Ur, LPF, POC: NEGATIVE
Crystals, Ur, HPF, POC: NEGATIVE
RBC, urine, microscopic: NEGATIVE
Yeast, UA: NEGATIVE

## 2013-07-12 MED ORDER — METHYLPHENIDATE HCL ER (CD) 20 MG PO CPCR: 20.0000 mg | ORAL_CAPSULE | ORAL | Status: DC

## 2013-07-12 MED ORDER — DOXYCYCLINE HYCLATE 100 MG PO CAPS: 100.0000 mg | ORAL_CAPSULE | Freq: Two times a day (BID) | ORAL | Status: DC

## 2013-07-12 MED ORDER — METRONIDAZOLE 500 MG PO TABS: 500.0000 mg | ORAL_TABLET | Freq: Two times a day (BID) | ORAL | Status: DC

## 2013-07-12 MED ORDER — MEDROXYPROGESTERONE ACETATE 150 MG/ML IM SUSP
150.0000 mg | Freq: Once | INTRAMUSCULAR | Status: AC
  Administered 2013-07-12: 150 mg via INTRAMUSCULAR

## 2013-07-12 NOTE — Progress Notes (Signed)
[facility] [address], [place] [address] [phone]  Date:  07/12/2013   Name:  Jacqueline CobbleHarley E Johann   DOB:  [birth year]   MRN:  [id number]  PCP:  No PCP Per Patient    Chief Complaint: Annual Exam, Medication Refill and Cyst   History of Present Illness:  Jacqueline Kelly is a 23 y.o. very pleasant female patient who presents with the following:  Multiple complaints.  Has been exposed to STD and is requesting treatment.  Has no symptoms.  LMP.  Last month. Sexually active and not using contraceptive.  Amenable to OCP.  History of previously treated CT Has a cystic mass in left axilla that is tender. History of ADD last treated with adderall. Can't take the drug due to expense.    Patient Active Problem List   Diagnosis Date Noted  . Unspecified episodic mood disorder 09/04/2012  . ADHD (attention deficit hyperactivity disorder) 08/31/2012    Past Medical History  Diagnosis Date  . Allergy     No past surgical history on file.  History  Substance Use Topics  . Smoking status: Current Every Day Smoker -- 0.50 packs/day for 1 years    Types: Cigarettes  . Smokeless tobacco: Never Used  . Alcohol Use: Yes    Family History  Problem Relation Age of Onset  . Heart disease Paternal Grandmother   . Cancer Paternal Grandfather     No Known Allergies  Medication list has been reviewed and updated.  No current outpatient prescriptions on file prior to visit.   No current facility-administered medications on file prior to visit.    Review of Systems:  As per HPI, otherwise negative.    Physical Examination: Filed Vitals:   07/12/13 1017  BP: 130/80  Pulse: 91  Temp: 98.3 F (36.8 C)  Resp: 17   Filed Vitals:   07/12/13 1017  Height: 5' 5.5" (1.664 m)  Weight: 143 lb (64.864 kg)   Body mass index is 23.43 kg/(m^2). Ideal Body Weight: Weight in (lb) to have BMI = 25: 152.2  GEN: WDWN, NAD, Non-toxic, A & O x 3 HEENT: Atraumatic,  Normocephalic. Neck supple. No masses, No LAD. Ears and Nose: No external deformity. CV: RRR, No M/G/R. No JVD. No thrill. No extra heart sounds. PULM: CTA B, no wheezes, crackles, rhonchi. No retractions. No resp. distress. No accessory muscle use. ABD: S, NT, ND, +BS. No rebound. No HSM. EXTR: No c/c/e NEURO Normal gait.  PSYCH: Normally interactive. Conversant. Not depressed or anxious appearing.  Calm demeanor.    Assessment and Plan: Family planning Exposure STD Hydradenitis suppurativa "ADD" BV   Signed,  Phillips OdorJeffery Anderson, MD   Referred to Benny LennertSarah Weber for contraceptive and ADD management.  Results for orders placed in visit on 08/31/12  POCT URINE PREGNANCY      Result Value Ref Range   Preg Test, Ur Negative    POCT URINALYSIS DIPSTICK      Result Value Ref Range   Color, UA yellow     Clarity, UA clear     Glucose, UA neg     Bilirubin, UA neg     Ketones, UA neg     Spec Grav, UA >=1.030     Blood, UA trace     pH, UA 6.0     Protein, UA 30     Urobilinogen, UA 0.2     Nitrite, UA neg     Leukocytes, UA Trace  Results for orders placed in visit on 07/12/13  POCT URINE PREGNANCY      Result Value Ref Range   Preg Test, Ur Negative    POCT URINALYSIS DIPSTICK      Result Value Ref Range   Color, UA yellow     Clarity, UA clear     Glucose, UA neg     Bilirubin, UA neg     Ketones, UA neg     Spec Grav, UA >=1.030     Blood, UA neg     pH, UA 7.0     Protein, UA 300     Urobilinogen, UA 0.2     Nitrite, UA neg     Leukocytes, UA Trace    POCT UA - MICROSCOPIC ONLY      Result Value Ref Range   WBC, Ur, HPF, POC 4-8     RBC, urine, microscopic neg     Bacteria, U Microscopic trace     Mucus, UA postivie     Epithelial cells, urine per micros 1-4     Crystals, Ur, HPF, POC neg     Casts, Ur, LPF, POC neg     Yeast, UA neg    POCT WET PREP WITH KOH      Result Value Ref Range   Trichomonas, UA Negative     Clue Cells Wet Prep HPF POC  TNTC     Epithelial Wet Prep HPF POC TNTC     Yeast Wet Prep HPF POC neg     Bacteria Wet Prep HPF POC 2+     RBC Wet Prep HPF POC neg     WBC Wet Prep HPF POC 10-20     KOH Prep POC Negative

## 2013-07-12 NOTE — Progress Notes (Signed)
I spoke with patient at length regarding birth control options for her.  She has been on OCP in the past but could not remember to take them. She is not interested in getting pregnant.  She currently has only 1 partner and is having STD screening done today because of concern about her current partner.  She has been sexually active without condoms since her last menses which was about 13 days ago.  She would like to start on Depo-provera today and we discussed the risk associated with unprotected sex timing.  She will be calling and making an appt to have a Nexplanon placed before the next depo is due.

## 2013-07-12 NOTE — Patient Instructions (Addendum)
Take a pregnancy test in 2 weeks because we did a quick start with Depo-provera.  [facility] for placement appt for nexplanon. Call [facility] for ADD/mood evaluation.Bacterial Vaginosis Bacterial vaginosis is a vaginal infection that occurs when the normal balance of bacteria in the vagina is disrupted. It results from an overgrowth of certain bacteria. This is the most common vaginal infection in women of childbearing age. Treatment is important to prevent complications, especially in pregnant women, as it can cause a premature delivery. CAUSES  Bacterial vaginosis is caused by an increase in harmful bacteria that are normally present in smaller amounts in the vagina. Several different kinds of bacteria can cause bacterial vaginosis. However, the reason that the condition develops is not fully understood. RISK FACTORS Certain activities or behaviors can put you at an increased risk of developing bacterial vaginosis, including:  Having a new sex partner or multiple sex partners.  Douching.  Using an intrauterine device (IUD) for contraception. Women do not get bacterial vaginosis from toilet seats, bedding, swimming pools, or contact with objects around them. SIGNS AND SYMPTOMS  Some women with bacterial vaginosis have no signs or symptoms. Common symptoms include:  Grey vaginal discharge.  A fishlike odor with discharge, especially after sexual intercourse.  Itching or burning of the vagina and vulva.  Burning or pain with urination. DIAGNOSIS  Your health care provider will take a medical history and examine the vagina for signs of bacterial vaginosis. A sample of vaginal fluid may be taken. Your health care provider will look at this sample under a microscope to check for bacteria and abnormal cells. A vaginal pH test may also be done.  TREATMENT  Bacterial vaginosis may be treated with antibiotic medicines. These may be given in the form of a pill  or a vaginal cream. A second round of antibiotics may be prescribed if the condition comes back after treatment.  HOME CARE INSTRUCTIONS   Only take over-the-counter or prescription medicines as directed by your health care provider.  If antibiotic medicine was prescribed, take it as directed. Make sure you finish it even if you start to feel better.  Do not have sex until treatment is completed.  Tell all sexual partners that you have a vaginal infection. They should see their health care provider and be treated if they have problems, such as a mild rash or itching.  Practice safe sex by using condoms and only having one sex partner. SEEK MEDICAL CARE IF:   Your symptoms are not improving after 3 days of treatment.  You have increased discharge or pain.  You have a fever. MAKE SURE YOU:   Understand these instructions.  Will watch your condition.  Will get help right away if you are not doing well or get worse. FOR MORE INFORMATION  Centers for Disease Control and Prevention, Division of STD Prevention: SolutionApps.co.zawww.cdc.gov/std American Sexual Health Association (ASHA): www.ashastd.org  Document Released: 02/28/2005 Document Revised: 12/19/2012 Document Reviewed: 10/10/2012 [facility] Patient Information 2014 [employer] [place].

## 2013-07-13 LAB — GC/CHLAMYDIA PROBE AMP
CT Probe RNA: NEGATIVE
GC Probe RNA: NEGATIVE

## 2013-08-14 ENCOUNTER — Ambulatory Visit: Payer: 59 | Admitting: Physician Assistant

## 2013-09-18 ENCOUNTER — Encounter: Payer: Self-pay | Admitting: Physician Assistant

## 2013-09-18 ENCOUNTER — Ambulatory Visit (INDEPENDENT_AMBULATORY_CARE_PROVIDER_SITE_OTHER): Payer: 59 | Admitting: Physician Assistant

## 2013-09-18 VITALS — BP 108/65 | HR 77 | Temp 98.3°F | Resp 16 | Ht 66.0 in | Wt 150.0 lb

## 2013-09-18 DIAGNOSIS — Z3049 Encounter for surveillance of other contraceptives: Secondary | ICD-10-CM

## 2013-09-18 DIAGNOSIS — Z3042 Encounter for surveillance of injectable contraceptive: Secondary | ICD-10-CM

## 2013-09-18 DIAGNOSIS — F909 Attention-deficit hyperactivity disorder, unspecified type: Secondary | ICD-10-CM

## 2013-09-18 DIAGNOSIS — L732 Hidradenitis suppurativa: Secondary | ICD-10-CM

## 2013-09-18 MED ORDER — CEPHALEXIN 500 MG PO CAPS: 500.0000 mg | ORAL_CAPSULE | Freq: Three times a day (TID) | ORAL | Status: AC

## 2013-09-18 MED ORDER — METHYLPHENIDATE HCL ER (CD) 20 MG PO CPCR: 20.0000 mg | ORAL_CAPSULE | ORAL | Status: DC

## 2013-09-18 MED ORDER — MEDROXYPROGESTERONE ACETATE 150 MG/ML IM SUSP
150.0000 mg | Freq: Once | INTRAMUSCULAR | Status: AC
  Administered 2013-09-18: 150 mg via INTRAMUSCULAR

## 2013-09-18 MED ORDER — IBUPROFEN 800 MG PO TABS: 800.0000 mg | ORAL_TABLET | Freq: Three times a day (TID) | ORAL | Status: AC | PRN

## 2013-09-18 NOTE — Patient Instructions (Addendum)
Eliott NineMichie Dew - [facility] - [phone] Kellie MoorJohn Holt - [facility] - [phone] Ranell Patrickhomas Heeding - [facility]- [phone] Scott Hinkle - [phone] Ellyn HackBecki Kincaid - [phone] [facility] - [phone]  Depo provera window for next injection - 9/23-10/7  [facility] Policy for Prescribing Controlled Substances (Revised 01/2012) 1. Prescriptions for controlled substances will be filled by ONE provider at [facility] with whom you have established and developed a plan for your care, including follow-up. 2. You are encouraged to schedule an appointment with your prescriber at our appointment center for follow-up visits whenever possible. 3. If you request a prescription for the controlled substance while at [facility] for an acute problem (with someone other than your regular prescriber), you MAY be given a ONE-TIME prescription for a 30-day supply of the controlled substance, to allow time for you to return to see your regular prescriber for additional prescriptions. 4. Please sign up for mychart and you can request the medication monthly through that.  You must come and pick up your Rx monthly.

## 2013-09-18 NOTE — Progress Notes (Signed)
   Subjective:    Patient ID: Jacqueline Kelly, female    DOB: May 26, 1990, 23 y.o.   MRN: [id number]  HPI Pt presents to clinic with for her depo injection and bumps under her arm.  She is a few days early for her depo but she is going out of town and her mother would like her to get.  She started having some BTB yesterday - like spotting but this is the 1st bleeding she has had since starting on her Depo in May.  She also has hydradenitis bilateral axilla - the left side has been draining for months and now they are starting to hurt her more and they are getting larger - she is leaving for the beach and wants to know if they need treatment - she also needs a refill of her Metadate.  She has never had formal evaluation for ADD but really feels like the medication is helping - she is focused at work since starting on the medications. She has no side effects that she has noticed.    Review of Systems  Skin: Positive for wound.       Objective:   Physical Exam  Vitals reviewed. Constitutional: She is oriented to person, place, and time. She appears well-developed and well-nourished.  HENT:  Head: Normocephalic and atraumatic.  Right Ear: External ear normal.  Left Ear: External ear normal.  Pulmonary/Chest: Effort normal.  Neurological: She is alert and oriented to person, place, and time.  Skin: Skin is warm and dry.  Bilateral suppurativa hydradenitis - both sides inflamed and indurated - drainage on the L where wound culture was obtained - the areas are not large enough for I&D  Psychiatric: She has a normal mood and affect. Her behavior is normal. Judgment and thought content normal.       Assessment & Plan:  Encounter for Depo-Provera contraception - Plan: medroxyPROGESTERone (DEPO-PROVERA) injection 150 mg - due to patient leaving town we will give the injection 1 week early though we should not plan on this in the future.  Hydradenitis - Plan: cephALEXin (KEFLEX) 500 MG capsule,  ibuprofen (ADVIL,MOTRIN) 800 MG tablet, Wound culture - pt to use warm compresses - treated with Keflex due to going to the beach   Attention deficit hyperactivity disorder (ADHD), unspecified ADHD type - Plan: methylphenidate (METADATE CD) 20 MG CR capsule - she has never been formally evaluated but feels that the medication is helping - she was given names for psychologist for a formal evaluation.  I can refill her medication 1 more time before I need the results of her formal evaluation - once I get that if she has the diagnosis I can continue to refill her medications for 6 months.  Benny LennertSarah Weber PA-C  [facility] 09/18/2013 4:57 PM

## 2013-09-22 LAB — WOUND CULTURE
Gram Stain: NONE SEEN
Gram Stain: NONE SEEN

## 2016-05-13 DIAGNOSIS — Z6827 Body mass index (BMI) 27.0-27.9, adult: Secondary | ICD-10-CM | POA: Diagnosis not present

## 2016-05-13 DIAGNOSIS — Z124 Encounter for screening for malignant neoplasm of cervix: Secondary | ICD-10-CM | POA: Diagnosis not present

## 2016-05-13 DIAGNOSIS — Z01419 Encounter for gynecological examination (general) (routine) without abnormal findings: Secondary | ICD-10-CM | POA: Diagnosis not present

## 2016-10-19 ENCOUNTER — Encounter: Payer: Self-pay | Admitting: Emergency Medicine

## 2016-10-19 ENCOUNTER — Ambulatory Visit (INDEPENDENT_AMBULATORY_CARE_PROVIDER_SITE_OTHER): Payer: BLUE CROSS/BLUE SHIELD | Admitting: Emergency Medicine

## 2016-10-19 VITALS — BP 119/79 | HR 85 | Temp 98.5°F | Resp 16 | Ht 66.0 in | Wt 171.0 lb

## 2016-10-19 DIAGNOSIS — R112 Nausea with vomiting, unspecified: Secondary | ICD-10-CM | POA: Insufficient documentation

## 2016-10-19 LAB — POCT CBC
Granulocyte percent: 62.4 %G (ref 37–80)
HCT, POC: 38.9 % (ref 37.7–47.9)
Hemoglobin: 13.3 g/dL (ref 12.2–16.2)
Lymph, poc: 2 (ref 0.6–3.4)
MCH, POC: 30.3 pg (ref 27–31.2)
MCHC: 34.1 g/dL (ref 31.8–35.4)
MCV: 88.9 fL (ref 80–97)
MID (cbc): 0.3 (ref 0–0.9)
MPV: 8.1 fL (ref 0–99.8)
POC Granulocyte: 3.8 (ref 2–6.9)
POC LYMPH PERCENT: 33 %L (ref 10–50)
POC MID %: 4.6 %M (ref 0–12)
Platelet Count, POC: 243 10*3/uL (ref 142–424)
RBC: 4.38 M/uL (ref 4.04–5.48)
RDW, POC: 13 %
WBC: 6.1 10*3/uL (ref 4.6–10.2)

## 2016-10-19 LAB — POC MICROSCOPIC URINALYSIS (UMFC): Mucus: ABSENT

## 2016-10-19 LAB — POCT URINE PREGNANCY: Preg Test, Ur: NEGATIVE

## 2016-10-19 MED ORDER — ONDANSETRON 4 MG PO TBDP
4.0000 mg | ORAL_TABLET | Freq: Once | ORAL | Status: AC
  Administered 2016-10-19: 4 mg via ORAL

## 2016-10-19 MED ORDER — ONDANSETRON HCL 4 MG PO TABS: 4.0000 mg | ORAL_TABLET | Freq: Three times a day (TID) | ORAL | 0 refills | Status: DC | PRN

## 2016-10-19 NOTE — Patient Instructions (Addendum)
   IF you received an x-ray today, you will receive an invoice from [facility]. Please contact [facility] at [phone] with questions or concerns regarding your invoice.   IF you received labwork today, you will receive an invoice from [facility]. Please contact [facility] at [phone] with questions or concerns regarding your invoice.   Our billing staff will not be able to assist you with questions regarding bills from these companies.  You will be contacted with the lab results as soon as they are available. The fastest way to get your results is to activate your My Chart account. Instructions are located on the last page of this paperwork. If you have not heard from us regarding the results in 2 weeks, please contact this office.     Nausea and Vomiting, Adult Feeling sick to your stomach (nausea) means that your stomach is upset or you feel like you have to throw up (vomit). Feeling more and more sick to your stomach can lead to throwing up. Throwing up happens when food and liquid from your stomach are thrown up and out the mouth. Throwing up can make you feel weak and cause you to get dehydrated. Dehydration can make you tired and thirsty, make you have a dry mouth, and make it so you pee (urinate) less often. Older adults and people with other diseases or a weak defense system (immune system) are at higher risk for dehydration. If you feel sick to your stomach or if you throw up, it is important to follow instructions from your doctor about how to take care of yourself. Follow these instructions at home: Eating and drinking Follow these instructions as told by your doctor:  Take an oral rehydration solution (ORS). This is a drink that is sold at pharmacies and stores.  Drink clear fluids in small amounts as you are able, such as: ? Water. ? Ice chips. ? Diluted fruit juice. ? Low-calorie sports drinks.  Eat bland, easy-to-digest foods in small amounts as you  are able, such as: ? Bananas. ? Applesauce. ? Rice. ? Low-fat (lean) meats. ? Toast. ? Crackers.  Avoid fluids that have a lot of sugar or caffeine in them.  Avoid alcohol.  Avoid spicy or fatty foods.  General instructions  Drink enough fluid to keep your pee (urine) clear or pale yellow.  Wash your hands often. If you cannot use soap and water, use hand sanitizer.  Make sure that all people in your home wash their hands well and often.  Take over-the-counter and prescription medicines only as told by your doctor.  Rest at home while you get better.  Watch your condition for any changes.  Breathe slowly and deeply when you feel sick to your stomach.  Keep all follow-up visits as told by your doctor. This is important. Contact a doctor if:  You have a fever.  You cannot keep fluids down.  Your symptoms get worse.  You have new symptoms.  You feel sick to your stomach for more than two days.  You feel light-headed or dizzy.  You have a headache.  You have muscle cramps. Get help right away if:  You have pain in your chest, neck, arm, or jaw.  You feel very weak or you pass out (faint).  You throw up again and again.  You see blood in your throw-up.  Your throw-up looks like black coffee grounds.  You have bloody or black poop (stools) or poop that look like tar.  You   have a very bad headache, a stiff neck, or both.  You have a rash.  You have very bad pain, cramping, or bloating in your belly (abdomen).  You have trouble breathing.  You are breathing very quickly.  Your heart is beating very quickly.  Your skin feels cold and clammy.  You feel confused.  You have pain when you pee.  You have signs of dehydration, such as: ? Dark pee, hardly any pee, or no pee. ? Cracked lips. ? Dry mouth. ? Sunken eyes. ? Sleepiness. ? Weakness. These symptoms may be an emergency. Do not wait to see if the symptoms will go away. Get medical help  right away. Call your local emergency services (911 in the [place] Do not drive yourself to the hospital. This information is not intended to replace advice given to you by your health care provider. Make sure you discuss any questions you have with your health care provider. Document Released: 08/17/2007 Document Revised: 09/18/2015 Document Reviewed: 11/04/2014 Elsevier Interactive Patient Education  2018 Elsevier Inc.  

## 2016-10-19 NOTE — Progress Notes (Signed)
Jacqueline Kelly 26 y.o.   Chief Complaint  Patient presents with  . Emesis    x 2 days with weakness    HISTORY OF PRESENT ILLNESS: This is a 26 y.o. female complaining of nausea and vomiting x 2-3 days. Sexually active; has had Nexplanon sq patch x close to 4 years.  Emesis   This is a new problem. The current episode started in the past 7 days. The problem occurs 2 to 4 times per day. The problem has been waxing and waning. There has been no fever. Pertinent negatives include no abdominal pain, chest pain, chills, coughing, diarrhea, dizziness, fever, headaches, myalgias, sweats, URI or weight loss. Risk factors: none. She has tried increased fluids and diet change for the symptoms. The treatment provided mild relief.     Prior to Admission medications   Medication Sig Start Date End Date Taking? Authorizing Provider  Etonogestrel (NEXPLANON [place]) Inject into the skin.   Yes [provider]  ibuprofen (ADVIL,MOTRIN) 800 MG tablet Take 1 tablet (800 mg total) by mouth every 8 (eight) hours as needed. 09/18/13  Yes Weber, Dema SeverinSarah L, PA-C  methylphenidate (METADATE CD) 20 MG CR capsule Take 1 capsule (20 mg total) by mouth every morning. Patient not taking: Reported on 10/19/2016 09/18/13   Morrell RiddleWeber, Sarah L, PA-C    No Known Allergies  Patient Active Problem List   Diagnosis Date Noted  . Unspecified episodic mood disorder 09/04/2012  . ADHD (attention deficit hyperactivity disorder) 08/31/2012    Past Medical History:  Diagnosis Date  . Allergy     No past surgical history on file.  Social History   Social History  . Marital status: Single    Spouse name: N/A  . Number of children: N/A  . Years of education: N/A   Occupational History  . Not on file.   Social History Main Topics  . Smoking status: Current Every Day Smoker    Packs/day: 0.50    Years: 1.00    Types: Cigarettes  . Smokeless tobacco: Never Used  . Alcohol use Yes  . Drug use: No  . Sexual  activity: Yes    Birth control/ protection: None   Other Topics Concern  . Not on file   Social History Narrative   Single. Education: [employer].     Family History  Problem Relation Age of Onset  . Heart disease Paternal Grandmother   . Cancer Paternal Grandfather      Review of Systems  Constitutional: Negative for chills, fever and weight loss.  HENT: Negative.  Negative for ear pain, nosebleeds, sinus pain and sore throat.   Eyes: Negative for blurred vision and double vision.  Respiratory: Negative.  Negative for cough and shortness of breath.   Cardiovascular: Negative.  Negative for chest pain, palpitations and leg swelling.  Gastrointestinal: Positive for nausea and vomiting. Negative for abdominal pain, blood in stool, constipation, diarrhea, heartburn and melena.  Genitourinary: Negative.  Negative for dysuria and hematuria.  Musculoskeletal: Negative for back pain, joint pain and myalgias.  Skin: Negative.  Negative for rash.  Neurological: Positive for weakness. Negative for dizziness and headaches.  Endo/Heme/Allergies: Negative.   All other systems reviewed and are negative.  Vitals:   10/19/16 0933  BP: 119/79  Pulse: 85  Resp: 16  Temp: 98.5 F (36.9 C)     Physical Exam  Constitutional: She is oriented to person, place, and time. She appears well-developed and well-nourished.  HENT:  Head: Normocephalic and  atraumatic.  Right Ear: External ear normal.  Left Ear: External ear normal.  Mouth/Throat: Oropharynx is clear and moist.  Eyes: Pupils are equal, round, and reactive to light. Conjunctivae and EOM are normal.  Neck: Normal range of motion. Neck supple. No JVD present.  Cardiovascular: Normal rate, regular rhythm and normal heart sounds.   Pulmonary/Chest: Effort normal and breath sounds normal.  Abdominal: Soft. Bowel sounds are normal. She exhibits no distension. There is no tenderness.  Musculoskeletal: Normal range of motion.    Lymphadenopathy:    She has no cervical adenopathy.  Neurological: She is alert and oriented to person, place, and time. No sensory deficit. She exhibits normal muscle tone.  Skin: Skin is warm and dry. Capillary refill takes less than 2 seconds. No rash noted.  Psychiatric: She has a normal mood and affect. Her behavior is normal.  Vitals reviewed.  Results for orders placed or performed in visit on 10/19/16 (from the past 24 hour(s))  POCT CBC     Status: None   Collection Time: 10/19/16 10:09 AM  Result Value Ref Range   WBC 6.1 4.6 - 10.2 K/uL   Lymph, poc 2.0 0.6 - 3.4   POC LYMPH PERCENT 33.0 10 - 50 %L   MID (cbc) 0.3 0 - 0.9   POC MID % 4.6 0 - 12 %M   POC Granulocyte 3.8 2 - 6.9   Granulocyte percent 62.4 37 - 80 %G   RBC 4.38 4.04 - 5.48 M/uL   Hemoglobin 13.3 12.2 - 16.2 g/dL   HCT, POC 16.1 09.6 - 47.9 %   MCV 88.9 80 - 97 fL   MCH, POC 30.3 27 - 31.2 pg   MCHC 34.1 31.8 - 35.4 g/dL   RDW, POC 04.5 %   Platelet Count, POC 243 142 - 424 K/uL   MPV 8.1 0 - 99.8 fL  POCT urine pregnancy     Status: None   Collection Time: 10/19/16 10:13 AM  Result Value Ref Range   Preg Test, Ur Negative Negative  POCT Microscopic Urinalysis ([facility])     Status: Abnormal   Collection Time: 10/19/16 10:22 AM  Result Value Ref Range   WBC,UR,HPF,POC Few (A) None WBC/hpf   RBC,UR,HPF,POC None None RBC/hpf   Bacteria Few (A) None, Too numerous to count   Mucus Absent Absent   Epithelial Cells, UR Per Microscopy Few (A) None, Too numerous to count cells/hpf    Pt has no urinary symptoms and states her urine always has abnormal findings.  ASSESSMENT & PLAN: Jacqueline Kelly was seen today for emesis.  Diagnoses and all orders for this visit:  Non-intractable vomiting with nausea, unspecified vomiting type -     ondansetron (ZOFRAN-ODT) disintegrating tablet 4 mg; Take 1 tablet (4 mg total) by mouth once. -     POCT CBC -     POCT Microscopic Urinalysis ([facility]) -     POCT urine pregnancy -      Comprehensive metabolic panel -     Urine Culture -     Urine Microscopic  Other orders -     ondansetron (ZOFRAN) 4 MG tablet; Take 1 tablet (4 mg total) by mouth every 8 (eight) hours as needed for nausea or vomiting.    Patient Instructions       IF you received an x-ray today, you will receive an invoice from [facility]. Please contact [facility] at [phone] with questions or concerns regarding your invoice.   IF you  received labwork today, you will receive an invoice from [facility]. Please contact [facility] at [phone] with questions or concerns regarding your invoice.   Our billing staff will not be able to assist you with questions regarding bills from these companies.  You will be contacted with the lab results as soon as they are available. The fastest way to get your results is to activate your My Chart account. Instructions are located on the last page of this paperwork. If you have not heard from [place] regarding the results in 2 weeks, please contact this office.     Nausea and Vomiting, Adult Feeling sick to your stomach (nausea) means that your stomach is upset or you feel like you have to throw up (vomit). Feeling more and more sick to your stomach can lead to throwing up. Throwing up happens when food and liquid from your stomach are thrown up and out the mouth. Throwing up can make you feel weak and cause you to get dehydrated. Dehydration can make you tired and thirsty, make you have a dry mouth, and make it so you pee (urinate) less often. Older adults and people with other diseases or a weak defense system (immune system) are at higher risk for dehydration. If you feel sick to your stomach or if you throw up, it is important to follow instructions from your doctor about how to take care of yourself. Follow these instructions at home: Eating and drinking Follow these instructions as told by your doctor:  Take an oral rehydration solution  (ORS). This is a drink that is sold at pharmacies and stores.  Drink clear fluids in small amounts as you are able, such as: ? Water. ? Ice chips. ? Diluted fruit juice. ? Low-calorie sports drinks.  Eat bland, easy-to-digest foods in small amounts as you are able, such as: ? Bananas. ? Applesauce. ? Rice. ? Low-fat (lean) meats. ? Toast. ? Crackers.  Avoid fluids that have a lot of sugar or caffeine in them.  Avoid alcohol.  Avoid spicy or fatty foods.  General instructions  Drink enough fluid to keep your pee (urine) clear or pale yellow.  Wash your hands often. If you cannot use soap and water, use hand sanitizer.  Make sure that all people in your home wash their hands well and often.  Take over-the-counter and prescription medicines only as told by your doctor.  Rest at home while you get better.  Watch your condition for any changes.  Breathe slowly and deeply when you feel sick to your stomach.  Keep all follow-up visits as told by your doctor. This is important. Contact a doctor if:  You have a fever.  You cannot keep fluids down.  Your symptoms get worse.  You have new symptoms.  You feel sick to your stomach for more than two days.  You feel light-headed or dizzy.  You have a headache.  You have muscle cramps. Get help right away if:  You have pain in your chest, neck, arm, or jaw.  You feel very weak or you pass out (faint).  You throw up again and again.  You see blood in your throw-up.  Your throw-up looks like black coffee grounds.  You have bloody or black poop (stools) or poop that look like tar.  You have a very bad headache, a stiff neck, or both.  You have a rash.  You have very bad pain, cramping, or bloating in your belly (abdomen).  You have trouble breathing.  You are breathing very quickly.  Your heart is beating very quickly.  Your skin feels cold and clammy.  You feel confused.  You have pain when you  pee.  You have signs of dehydration, such as: ? Dark pee, hardly any pee, or no pee. ? Cracked lips. ? Dry mouth. ? Sunken eyes. ? Sleepiness. ? Weakness. These symptoms may be an emergency. Do not wait to see if the symptoms will go away. Get medical help right away. Call your local emergency services (911 in the [place] Do not drive yourself to the hospital. This information is not intended to replace advice given to you by your health care provider. Make sure you discuss any questions you have with your health care provider. Document Released: 08/17/2007 Document Revised: 09/18/2015 Document Reviewed: 11/04/2014 Elsevier Interactive Patient Education  2018 Elsevier Inc.      Edwina Barth, MD [facility]

## 2016-10-20 ENCOUNTER — Encounter: Payer: Self-pay | Admitting: Radiology

## 2016-10-20 LAB — COMPREHENSIVE METABOLIC PANEL
ALT: 10 IU/L (ref 0–32)
AST: 12 IU/L (ref 0–40)
Albumin/Globulin Ratio: 2 (ref 1.2–2.2)
Albumin: 4.3 g/dL (ref 3.5–5.5)
Alkaline Phosphatase: 48 IU/L (ref 39–117)
BUN/Creatinine Ratio: 13 (ref 9–23)
BUN: 9 mg/dL (ref 6–20)
Bilirubin Total: 0.7 mg/dL (ref 0.0–1.2)
CO2: 21 mmol/L (ref 20–29)
Calcium: 9.3 mg/dL (ref 8.7–10.2)
Chloride: 105 mmol/L (ref 96–106)
Creatinine, Ser: 0.72 mg/dL (ref 0.57–1.00)
GFR calc Af Amer: 136 mL/min/{1.73_m2} (ref >59)
GFR calc non Af Amer: 118 mL/min/{1.73_m2} (ref >59)
Globulin, Total: 2.2 g/dL (ref 1.5–4.5)
Glucose: 80 mg/dL (ref 65–99)
Potassium: 4 mmol/L (ref 3.5–5.2)
Sodium: 139 mmol/L (ref 134–144)
Total Protein: 6.5 g/dL (ref 6.0–8.5)

## 2016-10-20 LAB — URINALYSIS, MICROSCOPIC ONLY: Casts: NONE SEEN /lpf

## 2016-10-20 LAB — URINE CULTURE

## 2016-10-21 ENCOUNTER — Telehealth: Payer: Self-pay | Admitting: Emergency Medicine

## 2016-10-21 NOTE — Telephone Encounter (Signed)
Spoke with patient and gave her lab results. Advised patient that a letter was sent in the mail as well. Patient verbalized understanding

## 2016-10-21 NOTE — Telephone Encounter (Signed)
Pt is calling to check on the status of her lab results.  Please advise with results when ready.  [phone]

## 2016-11-04 DIAGNOSIS — Z23 Encounter for immunization: Secondary | ICD-10-CM | POA: Diagnosis not present

## 2016-11-18 DIAGNOSIS — Z3046 Encounter for surveillance of implantable subdermal contraceptive: Secondary | ICD-10-CM | POA: Diagnosis not present

## 2016-11-18 DIAGNOSIS — Z6827 Body mass index (BMI) 27.0-27.9, adult: Secondary | ICD-10-CM | POA: Diagnosis not present

## 2016-12-27 ENCOUNTER — Ambulatory Visit (INDEPENDENT_AMBULATORY_CARE_PROVIDER_SITE_OTHER): Payer: BLUE CROSS/BLUE SHIELD | Admitting: Family Medicine

## 2016-12-27 ENCOUNTER — Encounter: Payer: Self-pay | Admitting: Family Medicine

## 2016-12-27 VITALS — BP 114/70 | HR 95 | Temp 98.2°F | Resp 16 | Ht 66.14 in | Wt 170.0 lb

## 2016-12-27 DIAGNOSIS — J069 Acute upper respiratory infection, unspecified: Secondary | ICD-10-CM | POA: Diagnosis not present

## 2016-12-27 DIAGNOSIS — R05 Cough: Secondary | ICD-10-CM

## 2016-12-27 DIAGNOSIS — R059 Cough, unspecified: Secondary | ICD-10-CM

## 2016-12-27 MED ORDER — AZELASTINE HCL 0.1 % NA SOLN: 2.0000 | Freq: Two times a day (BID) | NASAL | 1 refills | Status: DC

## 2016-12-27 MED ORDER — AZELASTINE HCL 0.1 % NA SOLN: 2.0000 | Freq: Two times a day (BID) | NASAL | Status: DC

## 2016-12-27 MED ORDER — BENZONATATE 100 MG PO CAPS: 100.0000 mg | ORAL_CAPSULE | Freq: Three times a day (TID) | ORAL | 0 refills | Status: DC | PRN

## 2016-12-27 NOTE — Patient Instructions (Addendum)
Drink plenty of fluids and get enough rest  Avoid direct exposure to other people  Take the benzonatate cough pills one or 2 pills 3 times daily as needed for daytime cough  You can take an over-the-counter cough syrup if needed for nighttime cough, one containing DM  Use the Astelin nose spray 1 or 2 sprays each nostril twice daily to open up your head and decreased drainage  In addition you can take an over-the-counter antihistamine decongestant such as Claritin-D or Allegra-D or Zyrtec-D if needed  Return if worse    IF you received an x-ray today, you will receive an invoice from [facility]. Please contact [facility] at [phone] with questions or concerns regarding your invoice.   IF you received labwork today, you will receive an invoice from [facility]. Please contact [facility] at [phone] with questions or concerns regarding your invoice.   Our billing staff will not be able to assist you with questions regarding bills from these companies.  You will be contacted with the lab results as soon as they are available. The fastest way to get your results is to activate your My Chart account. Instructions are located on the last page of this paperwork. If you have not heard from [place] regarding the results in 2 weeks, please contact this office.

## 2016-12-27 NOTE — Addendum Note (Signed)
Addended by: Wofford Stratton H on: 12/27/2016 09:49 AM   Modules accepted: Orders

## 2016-12-27 NOTE — Progress Notes (Signed)
Patient ID: Jacqueline Kelly, female    DOB: Apr 14, 1990  Age: 26 y.o. MRN: [id number]  Chief Complaint  Patient presents with  . Cough    dry cough x 2 days with some sore throat   . Nasal Congestion  . Fatigue    with chills x 2 days     Subjective:   For the last couple of days this young lady is been feeling ill withfatigue, chills, cough, nasal congestion. Minimal sore throat. She smokesabout one third to one half pack of cigarettes a day. She does not have children.Her last menstrual cycle was a little over a month ago, is now receiving Depo-Provera shots. She is not on regular medications. She has a boyfriendwho is not sick.  Current allergies, medications, problem list, past/family and social histories reviewed.  Objective:  BP 114/70   Pulse 95   Temp 98.2 F (36.8 C) (Oral)   Resp 16   Ht 5' 6.14" (1.68 m)   Wt 170 lb (77.1 kg)   LMP 11/18/2016 (Approximate)   SpO2 99%   BMI 27.32 kg/m   Pleasant young lady, looks like she feels a little fatigued. TMs normal. Throat not erythematous. Nose congested. Minimal tenderness in sinuses. Neck supple without nodes. Chest clear. Heart regular without murmur.  Assessment & Plan:   Assessment: 1. Viral upper respiratory infection   2. Cough       Plan: Will treat symptomatically for the viral infection.  No orders of the defined types were placed in this encounter.   Meds ordered this encounter  Medications  . medroxyPROGESTERone (DEPO-PROVERA) 150 MG/ML injection    Sig: Inject 150 mg into the muscle every 3 (three) months.  Marland Kitchen azelastine (ASTELIN) 0.1 % nasal spray 2 spray  . benzonatate (TESSALON) 100 MG capsule    Sig: Take 1-2 capsules (100-200 mg total) by mouth 3 (three) times daily as needed.    Dispense:  30 capsule    Refill:  0         Patient Instructions   Drink plenty of fluids and get enough rest  Avoid direct exposure to other people  Take the benzonatate cough pills one or 2 pills 3 times  daily as needed for daytime cough  You can take an over-the-counter cough syrup if needed for nighttime cough, one containing DM  Use the Astelin nose spray 1 or 2 sprays each nostril twice daily to open up your head and decreased drainage  In addition you can take an over-the-counter antihistamine decongestant such as Claritin-D or Allegra-D or Zyrtec-D if needed  Return if worse    IF you received an x-ray today, you will receive an invoice from [facility]. Please contact [facility] at [phone] with questions or concerns regarding your invoice.   IF you received labwork today, you will receive an invoice from [facility]. Please contact [facility] at [phone] with questions or concerns regarding your invoice.   Our billing staff will not be able to assist you with questions regarding bills from these companies.  You will be contacted with the lab results as soon as they are available. The fastest way to get your results is to activate your My Chart account. Instructions are located on the last page of this paperwork. If you have not heard from [place] regarding the results in 2 weeks, please contact this office.         Return if symptoms worsen or fail to improve.   HOPPER,DAVID, MD 12/27/2016

## 2017-02-14 DIAGNOSIS — Z6827 Body mass index (BMI) 27.0-27.9, adult: Secondary | ICD-10-CM | POA: Diagnosis not present

## 2017-02-14 DIAGNOSIS — Z3042 Encounter for surveillance of injectable contraceptive: Secondary | ICD-10-CM | POA: Diagnosis not present

## 2017-03-22 ENCOUNTER — Ambulatory Visit: Payer: BLUE CROSS/BLUE SHIELD | Admitting: Emergency Medicine

## 2017-03-22 ENCOUNTER — Encounter: Payer: Self-pay | Admitting: Emergency Medicine

## 2017-03-22 VITALS — BP 126/80 | HR 82 | Temp 97.7°F | Resp 18 | Ht 66.0 in | Wt 164.4 lb

## 2017-03-22 DIAGNOSIS — M7918 Myalgia, other site: Secondary | ICD-10-CM

## 2017-03-22 DIAGNOSIS — M549 Dorsalgia, unspecified: Secondary | ICD-10-CM | POA: Insufficient documentation

## 2017-03-22 DIAGNOSIS — M62838 Other muscle spasm: Secondary | ICD-10-CM

## 2017-03-22 MED ORDER — CYCLOBENZAPRINE HCL 10 MG PO TABS: 10.0000 mg | ORAL_TABLET | Freq: Every day | ORAL | 0 refills | Status: DC

## 2017-03-22 MED ORDER — DICLOFENAC SODIUM 75 MG PO TBEC: 75.0000 mg | DELAYED_RELEASE_TABLET | Freq: Two times a day (BID) | ORAL | 0 refills | Status: AC

## 2017-03-22 NOTE — Progress Notes (Signed)
Jacqueline Kelly 27 y.o.   Chief Complaint  Patient presents with  . Shoulder Pain    x3-4 days;  states its across her entire upper back    HISTORY OF PRESENT ILLNESS: This is a 27 y.o. female complaining of upper back pain x 4 days; denies direct trauma or associated symptoms.  HPI   Prior to Admission medications   Medication Sig Start Date End Date Taking? Authorizing Provider  ibuprofen (ADVIL,MOTRIN) 800 MG tablet Take 1 tablet (800 mg total) by mouth every 8 (eight) hours as needed. 09/18/13  Yes Weber, Dema Severin, PA-C  medroxyPROGESTERone (DEPO-PROVERA) 150 MG/ML injection Inject 150 mg into the muscle every 3 (three) months.   Yes [provider]    No Known Allergies  Patient Active Problem List   Diagnosis Date Noted  . Non-intractable vomiting with nausea 10/19/2016  . Unspecified episodic mood disorder 09/04/2012  . ADHD (attention deficit hyperactivity disorder) 08/31/2012    Past Medical History:  Diagnosis Date  . Allergy     No past surgical history on file.  Social History   Socioeconomic History  . Marital status: Single    Spouse name: Not on file  . Number of children: Not on file  . Years of education: Not on file  . Highest education level: Not on file  Social Needs  . Financial resource strain: Not on file  . Food insecurity - worry: Not on file  . Food insecurity - inability: Not on file  . Transportation needs - medical: Not on file  . Transportation needs - non-medical: Not on file  Occupational History  . Not on file  Tobacco Use  . Smoking status: Current Every Day Smoker    Packs/day: 0.50    Years: 1.00    Pack years: 0.50    Types: Cigarettes  . Smokeless tobacco: Never Used  Substance and Sexual Activity  . Alcohol use: Yes  . Drug use: No  . Sexual activity: Yes    Birth control/protection: None  Other Topics Concern  . Not on file  Social History Narrative   Single. Education: [employer].     Family History    Problem Relation Age of Onset  . Heart disease Paternal Grandmother   . Cancer Paternal Grandfather      Review of Systems  Constitutional: Negative.  Negative for chills and fever.  HENT: Negative.  Negative for sore throat.   Eyes: Negative.   Respiratory: Negative for cough and shortness of breath.   Cardiovascular: Negative for chest pain and palpitations.  Gastrointestinal: Negative for abdominal pain, blood in stool, constipation, diarrhea, nausea and vomiting.  Genitourinary: Negative.  Negative for dysuria and hematuria.  Musculoskeletal: Positive for back pain.  Skin: Negative for rash.  Neurological: Negative.  Negative for dizziness and headaches.  Endo/Heme/Allergies: Negative.   All other systems reviewed and are negative.  Vitals:   03/22/17 0914 03/22/17 1000  BP: (!) 144/85 126/80  Pulse: (!) 131 82  Resp: 18   Temp: 97.7 F (36.5 C)   SpO2: 98%      Physical Exam  Constitutional: She is oriented to person, place, and time. She appears well-developed and well-nourished.  HENT:  Head: Normocephalic and atraumatic.  Mouth/Throat: Oropharynx is clear and moist.  Eyes: Conjunctivae and EOM are normal. Pupils are equal, round, and reactive to light.  Neck: Normal range of motion. Neck supple.  Cardiovascular: Normal rate and regular rhythm.  Pulmonary/Chest: Effort normal and breath sounds  normal.  Abdominal: Soft. Bowel sounds are normal.  Musculoskeletal:  Upper back: +tenderness with muscle spasm R>L  Neurological: She is alert and oriented to person, place, and time. No sensory deficit. She exhibits normal muscle tone.  Skin: Skin is warm and dry. Capillary refill takes less than 2 seconds.  Psychiatric: She has a normal mood and affect. Her behavior is normal.  Vitals reviewed.  A total of 25 minutes was spent in the room with the patient, greater than 50% of which was in counseling/coordination of care.   ASSESSMENT & PLAN: Jacqueline Kelly was seen today  for shoulder pain.  Diagnoses and all orders for this visit:  Upper back pain -     diclofenac (VOLTAREN) 75 MG EC tablet; Take 1 tablet (75 mg total) by mouth 2 (two) times daily for 5 days.  Muscle spasm -     cyclobenzaprine (FLEXERIL) 10 MG tablet; Take 1 tablet (10 mg total) by mouth at bedtime.  Musculoskeletal pain -     diclofenac (VOLTAREN) 75 MG EC tablet; Take 1 tablet (75 mg total) by mouth 2 (two) times daily for 5 days.  Other orders -     Cancel: POCT urinalysis dipstick -     Cancel: POCT Microscopic Urinalysis ([facility])     Patient Instructions   Shoulder Pain Many things can cause shoulder pain, including:  An injury.  Moving the arm in the same way again and again (overuse).  Joint pain (arthritis).  Follow these instructions at home: Take these actions to help with your pain:  Squeeze a soft ball or a foam pad as much as you can. This helps to prevent swelling. It also makes the arm stronger.  Take over-the-counter and prescription medicines only as told by your doctor.  If told, put ice on the area: ? Put ice in a plastic bag. ? Place a towel between your skin and the bag. ? Leave the ice on for 20 minutes, 2-3 times per day. Stop putting on ice if it does not help with the pain.  If you were given a shoulder sling or immobilizer: ? Wear it as told. ? Remove it to shower or bathe. ? Move your arm as little as possible. ? Keep your hand moving. This helps prevent swelling.  Contact a doctor if:  Your pain gets worse.  Medicine does not help your pain.  You have new pain in your arm, hand, or fingers. Get help right away if:  Your arm, hand, or fingers: ? Tingle. ? Are numb. ? Are swollen. ? Are painful. ? Turn white or blue. This information is not intended to replace advice given to you by your health care provider. Make sure you discuss any questions you have with your health care provider. Document Released: 08/17/2007 Document  Revised: 10/25/2015 Document Reviewed: 06/23/2014 Elsevier Interactive Patient Education  2018 [employer].     IF you received an x-ray today, you will receive an invoice from [facility]. Please contact [facility] at [phone] with questions or concerns regarding your invoice.   IF you received labwork today, you will receive an invoice from [facility]. Please contact [facility] at [phone] with questions or concerns regarding your invoice.   Our billing staff will not be able to assist you with questions regarding bills from these companies.  You will be contacted with the lab results as soon as they are available. The fastest way to get your results is to activate your My Chart account. Instructions  are located on the last page of this paperwork. If you have not heard from us regarding the results in 2 weeks, please contact this office.      Back Pain, Adult Back pain is very common. The pain often gets better over time. The cause of back pain is usually not dangerous. Most people can learn to manage their back pain on their own. Follow these instructions at home: Watch your back pain for any changes. The following actions may help to lessen any pain you are feeling:  Stay active. Start with short walks on flat ground if you can. Try to walk farther each day.  Exercise regularly as told by your doctor. Exercise helps your back heal faster. It also helps avoid future injury by keeping your muscles strong and flexible.  Do not sit, drive, or stand in one place for more than 30 minutes.  Do not stay in bed. Resting more than 1-2 days can slow down your recovery.  Be careful when you bend or lift an object. Use good form when lifting: ? Bend at your knees. ? Keep the object close to your body. ? Do not twist.  Sleep on a firm mattress. Lie on your side, and bend your knees. If you lie on your back, put a pillow under your knees.  Take medicines only as  told by your doctor.  Put ice on the injured area. ? Put ice in a plastic bag. ? Place a towel between your skin and the bag. ? Leave the ice on for 20 minutes, 2-3 times a day for the first 2-3 days. After that, you can switch between ice and heat packs.  Avoid feeling anxious or stressed. Find good ways to deal with stress, such as exercise.  Maintain a healthy weight. Extra weight puts stress on your back.  Contact a doctor if:  You have pain that does not go away with rest or medicine.  You have worsening pain that goes down into your legs or buttocks.  You have pain that does not get better in one week.  You have pain at night.  You lose weight.  You have a fever or chills. Get help right away if:  You cannot control when you poop (bowel movement) or pee (urinate).  Your arms or legs feel weak.  Your arms or legs lose feeling (numbness).  You feel sick to your stomach (nauseous) or throw up (vomit).  You have belly (abdominal) pain.  You feel like you may pass out (faint). This information is not intended to replace advice given to you by your health care provider. Make sure you discuss any questions you have with your health care provider. Document Released: 08/17/2007 Document Revised: 08/06/2015 Document Reviewed: 07/02/2013 Elsevier Interactive Patient Education  2018 [employer] Inc.     Edwina BarthMiguel Kayleb Warshaw, MD [facility]

## 2017-03-22 NOTE — Patient Instructions (Addendum)
Shoulder Pain Many things can cause shoulder pain, including:  An injury.  Moving the arm in the same way again and again (overuse).  Joint pain (arthritis).  Follow these instructions at home: Take these actions to help with your pain:  Squeeze a soft ball or a foam pad as much as you can. This helps to prevent swelling. It also makes the arm stronger.  Take over-the-counter and prescription medicines only as told by your doctor.  If told, put ice on the area: ? Put ice in a plastic bag. ? Place a towel between your skin and the bag. ? Leave the ice on for 20 minutes, 2-3 times per day. Stop putting on ice if it does not help with the pain.  If you were given a shoulder sling or immobilizer: ? Wear it as told. ? Remove it to shower or bathe. ? Move your arm as little as possible. ? Keep your hand moving. This helps prevent swelling.  Contact a doctor if:  Your pain gets worse.  Medicine does not help your pain.  You have new pain in your arm, hand, or fingers. Get help right away if:  Your arm, hand, or fingers: ? Tingle. ? Are numb. ? Are swollen. ? Are painful. ? Turn white or blue. This information is not intended to replace advice given to you by your health care provider. Make sure you discuss any questions you have with your health care provider. Document Released: 08/17/2007 Document Revised: 10/25/2015 Document Reviewed: 06/23/2014 Elsevier Interactive Patient Education  2018 [employer]     IF you received an x-ray today, you will receive an invoice [facility]. Please contact [facility] at [phone] with questions or concerns regarding your invoice.   IF you received labwork today, you will receive an invoice from [facility]. Please contact [facility] at [phone] with questions or concerns regarding your invoice.   Our billing staff will not be able to assist you with questions regarding bills from these companies.  You  will be contacted with the lab results as soon as they are available. The fastest way to get your results is to activate your My Chart account. Instructions are located on the last page of this paperwork. If you have not heard from us regarding the results in 2 weeks, please contact this office.      Back Pain, Adult Back pain is very common. The pain often gets better over time. The cause of back pain is usually not dangerous. Most people can learn to manage their back pain on their own. Follow these instructions at home: Watch your back pain for any changes. The following actions may help to lessen any pain you are feeling:  Stay active. Start with short walks on flat ground if you can. Try to walk farther each day.  Exercise regularly as told by your doctor. Exercise helps your back heal faster. It also helps avoid future injury by keeping your muscles strong and flexible.  Do not sit, drive, or stand in one place for more than 30 minutes.  Do not stay in bed. Resting more than 1-2 days can slow down your recovery.  Be careful when you bend or lift an object. Use good form when lifting: ? Bend at your knees. ? Keep the object close to your body. ? Do not twist.  Sleep on a firm mattress. Lie on your side, and bend your knees. If you lie on your back, put a pillow under your  knees.  Take medicines only as told by your doctor.  Put ice on the injured area. ? Put ice in a plastic bag. ? Place a towel between your skin and the bag. ? Leave the ice on for 20 minutes, 2-3 times a day for the first 2-3 days. After that, you can switch between ice and heat packs.  Avoid feeling anxious or stressed. Find good ways to deal with stress, such as exercise.  Maintain a healthy weight. Extra weight puts stress on your back.  Contact a doctor if:  You have pain that does not go away with rest or medicine.  You have worsening pain that goes down into your legs or buttocks.  You have pain  that does not get better in one week.  You have pain at night.  You lose weight.  You have a fever or chills. Get help right away if:  You cannot control when you poop (bowel movement) or pee (urinate).  Your arms or legs feel weak.  Your arms or legs lose feeling (numbness).  You feel sick to your stomach (nauseous) or throw up (vomit).  You have belly (abdominal) pain.  You feel like you may pass out (faint). This information is not intended to replace advice given to you by your health care provider. Make sure you discuss any questions you have with your health care provider. Document Released: 08/17/2007 Document Revised: 08/06/2015 Document Reviewed: 07/02/2013 Elsevier Interactive Patient Education  [employer].

## 2017-04-03 ENCOUNTER — Ambulatory Visit: Payer: BLUE CROSS/BLUE SHIELD | Admitting: Family Medicine

## 2017-04-03 ENCOUNTER — Other Ambulatory Visit: Payer: Self-pay

## 2017-04-03 ENCOUNTER — Encounter: Payer: Self-pay | Admitting: Family Medicine

## 2017-04-03 VITALS — BP 110/80 | HR 123 | Temp 98.7°F | Resp 16 | Ht 66.0 in | Wt 165.2 lb

## 2017-04-03 DIAGNOSIS — R509 Fever, unspecified: Secondary | ICD-10-CM | POA: Diagnosis not present

## 2017-04-03 DIAGNOSIS — Z720 Tobacco use: Secondary | ICD-10-CM

## 2017-04-03 DIAGNOSIS — J101 Influenza due to other identified influenza virus with other respiratory manifestations: Secondary | ICD-10-CM | POA: Diagnosis not present

## 2017-04-03 DIAGNOSIS — R059 Cough, unspecified: Secondary | ICD-10-CM

## 2017-04-03 DIAGNOSIS — R05 Cough: Secondary | ICD-10-CM

## 2017-04-03 LAB — POC INFLUENZA A&B (BINAX/QUICKVUE)
Influenza A, POC: POSITIVE — AB
Influenza B, POC: NEGATIVE

## 2017-04-03 NOTE — Patient Instructions (Addendum)
Saline nasal spray at least 4 times per day if needed for nasal congestion, over the counter mucinex or mucinex DM as needed for cough, tylenol or ibuprofen over the counter for fever and body aches, and drink plenty of fluids. Rest as needed.  Other information as in instructions below.  Return to the clinic or go to the nearest emergency room if any of your symptoms worsen or new symptoms occur. I expect you to be improving this week. Follow-up if worsening.   Out of work until you are fever free for 24 hours off of fever reducing medications.  Let us know if we can help when you're ready to quit smoking. See other information below to help.    Influenza, Adult Influenza, more commonly known as "the flu," is a viral infection that primarily affects the respiratory tract. The respiratory tract includes organs that help you breathe, such as the lungs, nose, and throat. The flu causes many common cold symptoms, as well as a high fever and body aches. The flu spreads easily from person to person (is contagious). Getting a flu shot (influenza vaccination) every year is the best way to prevent influenza. What are the causes? Influenza is caused by a virus. You can catch the virus by:  Breathing in droplets from an infected person's cough or sneeze.  Touching something that was recently contaminated with the virus and then touching your mouth, nose, or eyes.  What increases the risk? The following factors may make you more likely to get the flu:  Not cleaning your hands frequently with soap and water or alcohol-based hand sanitizer.  Having close contact with many people during cold and flu season.  Touching your mouth, eyes, or nose without washing or sanitizing your hands first.  Not drinking enough fluids or not eating a healthy diet.  Not getting enough sleep or exercise.  Being under a high amount of stress.  Not getting a yearly (annual) flu shot.  You may be at a higher risk of  complications from the flu, such as a severe lung infection (pneumonia), if you:  Are over the age of 25.  Are pregnant.  Have a weakened disease-fighting system (immune system). You may have a weakened immune system if you: ? Have HIV or AIDS. ? Are undergoing chemotherapy. ? Aretaking medicines that reduce the activity of (suppress) the immune system.  Have a long-term (chronic) illness, such as heart disease, kidney disease, diabetes, or lung disease.  Have a liver disorder.  Are obese.  Have anemia.  What are the signs or symptoms? Symptoms of this condition typically last 4-10 days and may include:  Fever.  Chills.  Headache, body aches, or muscle aches.  Sore throat.  Cough.  Runny or congested nose.  Chest discomfort and cough.  Poor appetite.  Weakness or tiredness (fatigue).  Dizziness.  Nausea or vomiting.  How is this diagnosed? This condition may be diagnosed based on your medical history and a physical exam. Your health care provider may do a nose or throat swab test to confirm the diagnosis. How is this treated? If influenza is detected early, you can be treated with antiviral medicine that can reduce the length of your illness and the severity of your symptoms. This medicine may be given by mouth (orally) or through an IV tube that is inserted in one of your veins. The goal of treatment is to relieve symptoms by taking care of yourself at home. This may include taking over-the-counter medicines,  drinking plenty of fluids, and adding humidity to the air in your home. In some cases, influenza goes away on its own. Severe influenza or complications from influenza may be treated in a hospital. Follow these instructions at home:  Take over-the-counter and prescription medicines only as told by your health care provider.  Use a cool mist humidifier to add humidity to the air in your home. This can make breathing easier.  Rest as needed.  Drink  enough fluid to keep your urine clear or pale yellow.  Cover your mouth and nose when you cough or sneeze.  Wash your hands with soap and water often, especially after you cough or sneeze. If soap and water are not available, use hand sanitizer.  Stay home from work or school as told by your health care provider. Unless you are visiting your health care provider, try to avoid leaving home until your fever has been gone for 24 hours without the use of medicine.  Keep all follow-up visits as told by your health care provider. This is important. How is this prevented?  Getting an annual flu shot is the best way to avoid getting the flu. You may get the flu shot in late summer, fall, or winter. Ask your health care provider when you should get your flu shot.  Wash your hands often or use hand sanitizer often.  Avoid contact with people who are sick during cold and flu season.  Eat a healthy diet, drink plenty of fluids, get enough sleep, and exercise regularly. Contact a health care provider if:  You develop new symptoms.  You have: ? Chest pain. ? Diarrhea. ? A fever.  Your cough gets worse.  You produce more mucus.  You feel nauseous or you vomit. Get help right away if:  You develop shortness of breath or difficulty breathing.  Your skin or nails turn a bluish color.  You have severe pain or stiffness in your neck.  You develop a sudden headache or sudden pain in your face or ear.  You cannot stop vomiting. This information is not intended to replace advice given to you by your health care provider. Make sure you discuss any questions you have with your health care provider. Document Released: 02/26/2000 Document Revised: 08/06/2015 Document Reviewed: 12/23/2014 Elsevier Interactive Patient Education  2017 [employer].    Steps to Quit Smoking Smoking tobacco can be harmful to your health and can affect almost every organ in your body. Smoking puts you, and those  around you, at risk for developing many serious chronic diseases. Quitting smoking is difficult, but it is one of the best things that you can do for your health. It is never too late to quit. What are the benefits of quitting smoking? When you quit smoking, you lower your risk of developing serious diseases and conditions, such as:  Lung cancer or lung disease, such as COPD.  Heart disease.  Stroke.  Heart attack.  Infertility.  Osteoporosis and bone fractures.  Additionally, symptoms such as coughing, wheezing, and shortness of breath may get better when you quit. You may also find that you get sick less often because your body is stronger at fighting off colds and infections. If you are pregnant, quitting smoking can help to reduce your chances of having a baby of low birth weight. How do I get ready to quit? When you decide to quit smoking, create a plan to make sure that you are successful. Before you quit:  Pick a  date to quit. Set a date within the next two weeks to give you time to prepare.  Write down the reasons why you are quitting. Keep this list in places where you will see it often, such as on your bathroom mirror or in your car or wallet.  Identify the people, places, things, and activities that make you want to smoke (triggers) and avoid them. Make sure to take these actions: ? Throw away all cigarettes at home, at work, and in your car. ? Throw away smoking accessories, such as Set designer. ? Clean your car and make sure to empty the ashtray. ? Clean your home, including curtains and carpets.  Tell your family, friends, and coworkers that you are quitting. Support from your loved ones can make quitting easier.  Talk with your health care provider about your options for quitting smoking.  Find out what treatment options are covered by your health insurance.  What strategies can I use to quit smoking? Talk with your healthcare provider about different  strategies to quit smoking. Some strategies include:  Quitting smoking altogether instead of gradually lessening how much you smoke over a period of time. Research shows that quitting "cold [place]" is more successful than gradually quitting.  Attending in-person counseling to help you build problem-solving skills. You are more likely to have success in quitting if you attend several counseling sessions. Even short sessions of 10 minutes can be effective.  Finding resources and support systems that can help you to quit smoking and remain smoke-free after you quit. These resources are most helpful when you use them often. They can include: ? Online chats with a Veterinary surgeon. ? Telephone quitlines. ? Automotive engineer. ? Support groups or group counseling. ? Text messaging programs. ? Mobile phone applications.  Taking medicines to help you quit smoking. (If you are pregnant or breastfeeding, talk with your health care provider first.) Some medicines contain nicotine and some do not. Both types of medicines help with cravings, but the medicines that include nicotine help to relieve withdrawal symptoms. Your health care provider may recommend: ? Nicotine patches, gum, or lozenges. ? Nicotine inhalers or sprays. ? Non-nicotine medicine that is taken by mouth.  Talk with your health care provider about combining strategies, such as taking medicines while you are also receiving in-person counseling. Using these two strategies together makes you more likely to succeed in quitting than if you used either strategy on its own. If you are pregnant or breastfeeding, talk with your health care provider about finding counseling or other support strategies to quit smoking. Do not take medicine to help you quit smoking unless told to do so by your health care provider. What things can I do to make it easier to quit? Quitting smoking might feel overwhelming at first, but there is a lot that you can do to  make it easier. Take these important actions:  Reach out to your family and friends and ask that they support and encourage you during this time. Call telephone quitlines, reach out to support groups, or work with a counselor for support.  Ask people who smoke to avoid smoking around you.  Avoid places that trigger you to smoke, such as bars, parties, or smoke-break areas at work.  Spend time around people who do not smoke.  Lessen stress in your life, because stress can be a smoking trigger for some people. To lessen stress, try: ? Exercising regularly. ? Deep-breathing exercises. ? Yoga. ? Meditating. ? Performing  a body scan. This involves closing your eyes, scanning your body from head to toe, and noticing which parts of your body are particularly tense. Purposefully relax the muscles in those areas.  Download or purchase mobile phone or tablet apps (applications) that can help you stick to your quit plan by providing reminders, tips, and encouragement. There are many free apps, such as QuitGuide from the [employer] developer for Disease Control and Prevention). You can find other support for quitting smoking (smoking cessation) through smokefree.gov and other websites.  How will I feel when I quit smoking? Within the first 24 hours of quitting smoking, you may start to feel some withdrawal symptoms. These symptoms are usually most noticeable 2-3 days after quitting, but they usually do not last beyond 2-3 weeks. Changes or symptoms that you might experience include:  Mood swings.  Restlessness, anxiety, or irritation.  Difficulty concentrating.  Dizziness.  Strong cravings for sugary foods in addition to nicotine.  Mild weight gain.  Constipation.  Nausea.  Coughing or a sore throat.  Changes in how your medicines work in your body.  A depressed mood.  Difficulty sleeping (insomnia).  After the first 2-3 weeks of quitting, you may start to notice more positive results, such  as:  Improved sense of smell and taste.  Decreased coughing and sore throat.  Slower heart rate.  Lower blood pressure.  Clearer skin.  The ability to breathe more easily.  Fewer sick days.  Quitting smoking is very challenging for most people. Do not get discouraged if you are not successful the first time. Some people need to make many attempts to quit before they achieve long-term success. Do your best to stick to your quit plan, and talk with your health care provider if you have any questions or concerns. This information is not intended to replace advice given to you by your health care provider. Make sure you discuss any questions you have with your health care provider. Document Released: 02/22/2001 Document Revised: 10/27/2015 Document Reviewed: 07/15/2014 Elsevier Interactive Patient Education  2018 [employer].    IF you received an x-ray today, you will receive an invoice from [facility]. Please contact [facility] at [phone] with questions or concerns regarding your invoice.   IF you received labwork today, you will receive an invoice from [facility]. Please contact [facility] at [phone] with questions or concerns regarding your invoice.   Our billing staff will not be able to assist you with questions regarding bills from these companies.  You will be contacted with the lab results as soon as they are available. The fastest way to get your results is to activate your My Chart account. Instructions are located on the last page of this paperwork. If you have not heard from [place] regarding the results in 2 weeks, please contact this office.

## 2017-04-03 NOTE — Progress Notes (Signed)
Subjective:  By signing my name below, I, Stann Ore, attest that this documentation has been prepared under the direction and in the presence of Meredith Staggers, MD. Electronically Signed: Stann Ore, Scribe. 04/03/2017 , 10:02 AM .  Patient was seen in Room 12 .   Patient ID: Jacqueline Kelly, female    DOB: 12/30/1990, 27 y.o.   MRN: [id number] Chief Complaint  Patient presents with  . Cough    onset: 04/01/17, with clear/yellow drainage, st, night sweats until she is soaked with chills. Pt has taken dayquil and allergy pill at approx. 8 am this morning.  Per pt she feels really bad.   HPI Jacqueline Kelly is a 27 y.o. female  Here for cough, with history of tobacco use. Patient states her symptoms started about 4 days ago with productive cough (yellow/plain mucus) with chills, aches in her legs, and fatigue. She mentions night sweats yesterday with soaking the sheets. She's measured a fever Tmax 100.3. She's been taking Dayquil, ibuprofen and Allegra; last taken Dayquil at 8:00AM this morning. She's been drinking and eating chicken noodle soup. She received a flu shot in 2018 at a pharmacy. She is a current smoker, about half a pack per day for about 4 years now. She denies history of blood clots.   Patient Active Problem List   Diagnosis Date Noted  . Upper back pain 03/22/2017  . Muscle spasm 03/22/2017  . Musculoskeletal pain 03/22/2017  . Non-intractable vomiting with nausea 10/19/2016  . Unspecified episodic mood disorder 09/04/2012  . ADHD (attention deficit hyperactivity disorder) 08/31/2012   Past Medical History:  Diagnosis Date  . Allergy    No past surgical history on file. No Known Allergies Prior to Admission medications   Medication Sig Start Date End Date Taking? Authorizing Provider  cyclobenzaprine (FLEXERIL) 10 MG tablet Take 1 tablet (10 mg total) by mouth at bedtime. 03/22/17   Georgina Quint, MD  ibuprofen (ADVIL,MOTRIN) 800 MG tablet Take 1 tablet  (800 mg total) by mouth every 8 (eight) hours as needed. 09/18/13   Weber, Dema Severin, PA-C  medroxyPROGESTERone (DEPO-PROVERA) 150 MG/ML injection Inject 150 mg into the muscle every 3 (three) months.    [provider]   Social History   Socioeconomic History  . Marital status: Single    Spouse name: Not on file  . Number of children: Not on file  . Years of education: Not on file  . Highest education level: Not on file  Social Needs  . Financial resource strain: Not on file  . Food insecurity - worry: Not on file  . Food insecurity - inability: Not on file  . Transportation needs - medical: Not on file  . Transportation needs - non-medical: Not on file  Occupational History  . Not on file  Tobacco Use  . Smoking status: Current Every Day Smoker    Packs/day: 0.50    Years: 1.00    Pack years: 0.50    Types: Cigarettes  . Smokeless tobacco: Never Used  Substance and Sexual Activity  . Alcohol use: Yes  . Drug use: No  . Sexual activity: Yes    Birth control/protection: None  Other Topics Concern  . Not on file  Social History Narrative   Single. Education: [employer].    Review of Systems  Constitutional: Positive for activity change, chills, fatigue and fever.  HENT: Positive for congestion, rhinorrhea and sore throat.   Respiratory: Positive for cough. Negative for shortness of  breath and wheezing.   Cardiovascular: Negative for leg swelling.  Musculoskeletal: Positive for myalgias.  Neurological: Negative for headaches.       Objective:   Physical Exam  Constitutional: She is oriented to person, place, and time. She appears well-developed and well-nourished. No distress.  HENT:  Head: Normocephalic and atraumatic.  Right Ear: Hearing, tympanic membrane, external ear and ear canal normal.  Left Ear: Hearing, tympanic membrane, external ear and ear canal normal.  Nose: Nose normal.  Mouth/Throat: Oropharynx is clear and moist. No oropharyngeal exudate or  posterior oropharyngeal erythema.  Eyes: Conjunctivae and EOM are normal. Pupils are equal, round, and reactive to light.  Cardiovascular: Normal rate, regular rhythm, normal heart sounds and intact distal pulses.  No murmur heard. Pulmonary/Chest: Effort normal and breath sounds normal. No respiratory distress. She has no wheezes. She has no rhonchi.  Musculoskeletal:       Right lower leg: She exhibits no tenderness.       Left lower leg: She exhibits no tenderness.  Neurological: She is alert and oriented to person, place, and time.  Skin: Skin is warm and dry. No rash noted.  Psychiatric: She has a normal mood and affect. Her behavior is normal.  Vitals reviewed.   Vitals:   04/03/17 0944  BP: 110/80  Pulse: (!) 123  Resp: 16  Temp: 98.7 F (37.1 C)  TempSrc: Oral  SpO2: 98%  Weight: 165 lb 3.2 oz (74.9 kg)  Height: 5\' 6"  (1.676 m)   Results for orders placed or performed in visit on 04/03/17  POC Influenza A&B(BINAX/QUICKVUE)  Result Value Ref Range   Influenza A, POC Positive (A) Negative   Influenza B, POC Negative Negative       Assessment & Plan:   Jacqueline Kelly is a 27 y.o. female Influenza A with respiratory manifestations Cough - Plan: POC Influenza A&B(BINAX/QUICKVUE) Fever, unspecified - Plan: POC Influenza A&B(BINAX/QUICKVUE)  - Influenza A. Unfortunately beyond typical time of Tamiflu effectiveness. Overall reassuring exam.  -Symptomatic care discussed, RTC precautions. Out of work until fever free for 24 hours, note provided  Tobacco abuse  -Cessation discussed briefly, she plans on quitting along with the rest of the family as they discovered cancer in grandparents. Resources provided, RTC to discuss further if needed  No orders of the defined types were placed in this encounter.  Patient Instructions   Saline nasal spray at least 4 times per day if needed for nasal congestion, over the counter mucinex or mucinex DM as needed for cough, tylenol or  ibuprofen over the counter for fever and body aches, and drink plenty of fluids. Rest as needed.  Other information as in instructions below.  Return to the clinic or go to the nearest emergency room if any of your symptoms worsen or new symptoms occur. I expect you to be improving this week. Follow-up if worsening.   Out of work until you are fever free for 24 hours off of fever reducing medications.  Let us know if we can help when you're ready to quit smoking. See other information below to help.    Influenza, Adult Influenza, more commonly known as "the flu," is a viral infection that primarily affects the respiratory tract. The respiratory tract includes organs that help you breathe, such as the lungs, nose, and throat. The flu causes many common cold symptoms, as well as a high fever and body aches. The flu spreads easily from person to person (is contagious). Getting a flu  shot (influenza vaccination) every year is the best way to prevent influenza. What are the causes? Influenza is caused by a virus. You can catch the virus by:  Breathing in droplets from an infected person's cough or sneeze.  Touching something that was recently contaminated with the virus and then touching your mouth, nose, or eyes.  What increases the risk? The following factors may make you more likely to get the flu:  Not cleaning your hands frequently with soap and water or alcohol-based hand sanitizer.  Having close contact with many people during cold and flu season.  Touching your mouth, eyes, or nose without washing or sanitizing your hands first.  Not drinking enough fluids or not eating a healthy diet.  Not getting enough sleep or exercise.  Being under a high amount of stress.  Not getting a yearly (annual) flu shot.  You may be at a higher risk of complications from the flu, such as a severe lung infection (pneumonia), if you:  Are over the age of 34.  Are pregnant.  Have a weakened  disease-fighting system (immune system). You may have a weakened immune system if you: ? Have HIV or AIDS. ? Are undergoing chemotherapy. ? Aretaking medicines that reduce the activity of (suppress) the immune system.  Have a long-term (chronic) illness, such as heart disease, kidney disease, diabetes, or lung disease.  Have a liver disorder.  Are obese.  Have anemia.  What are the signs or symptoms? Symptoms of this condition typically last 4-10 days and may include:  Fever.  Chills.  Headache, body aches, or muscle aches.  Sore throat.  Cough.  Runny or congested nose.  Chest discomfort and cough.  Poor appetite.  Weakness or tiredness (fatigue).  Dizziness.  Nausea or vomiting.  How is this diagnosed? This condition may be diagnosed based on your medical history and a physical exam. Your health care provider may do a nose or throat swab test to confirm the diagnosis. How is this treated? If influenza is detected early, you can be treated with antiviral medicine that can reduce the length of your illness and the severity of your symptoms. This medicine may be given by mouth (orally) or through an IV tube that is inserted in one of your veins. The goal of treatment is to relieve symptoms by taking care of yourself at home. This may include taking over-the-counter medicines, drinking plenty of fluids, and adding humidity to the air in your home. In some cases, influenza goes away on its own. Severe influenza or complications from influenza may be treated in a hospital. Follow these instructions at home:  Take over-the-counter and prescription medicines only as told by your health care provider.  Use a cool mist humidifier to add humidity to the air in your home. This can make breathing easier.  Rest as needed.  Drink enough fluid to keep your urine clear or pale yellow.  Cover your mouth and nose when you cough or sneeze.  Wash your hands with soap and water  often, especially after you cough or sneeze. If soap and water are not available, use hand sanitizer.  Stay home from work or school as told by your health care provider. Unless you are visiting your health care provider, try to avoid leaving home until your fever has been gone for 24 hours without the use of medicine.  Keep all follow-up visits as told by your health care provider. This is important. How is this prevented?  Getting an  annual flu shot is the best way to avoid getting the flu. You may get the flu shot in late summer, fall, or winter. Ask your health care provider when you should get your flu shot.  Wash your hands often or use hand sanitizer often.  Avoid contact with people who are sick during cold and flu season.  Eat a healthy diet, drink plenty of fluids, get enough sleep, and exercise regularly. Contact a health care provider if:  You develop new symptoms.  You have: ? Chest pain. ? Diarrhea. ? A fever.  Your cough gets worse.  You produce more mucus.  You feel nauseous or you vomit. Get help right away if:  You develop shortness of breath or difficulty breathing.  Your skin or nails turn a bluish color.  You have severe pain or stiffness in your neck.  You develop a sudden headache or sudden pain in your face or ear.  You cannot stop vomiting. This information is not intended to replace advice given to you by your health care provider. Make sure you discuss any questions you have with your health care provider. Document Released: 02/26/2000 Document Revised: 08/06/2015 Document Reviewed: 12/23/2014 Elsevier Interactive Patient Education  2017 [employer].    Steps to Quit Smoking Smoking tobacco can be harmful to your health and can affect almost every organ in your body. Smoking puts you, and those around you, at risk for developing many serious chronic diseases. Quitting smoking is difficult, but it is one of the best things that you can do for  your health. It is never too late to quit. What are the benefits of quitting smoking? When you quit smoking, you lower your risk of developing serious diseases and conditions, such as:  Lung cancer or lung disease, such as COPD.  Heart disease.  Stroke.  Heart attack.  Infertility.  Osteoporosis and bone fractures.  Additionally, symptoms such as coughing, wheezing, and shortness of breath may get better when you quit. You may also find that you get sick less often because your body is stronger at fighting off colds and infections. If you are pregnant, quitting smoking can help to reduce your chances of having a baby of low birth weight. How do I get ready to quit? When you decide to quit smoking, create a plan to make sure that you are successful. Before you quit:  Pick a date to quit. Set a date within the next two weeks to give you time to prepare.  Write down the reasons why you are quitting. Keep this list in places where you will see it often, such as on your bathroom mirror or in your car or wallet.  Identify the people, places, things, and activities that make you want to smoke (triggers) and avoid them. Make sure to take these actions: ? Throw away all cigarettes at home, at work, and in your car. ? Throw away smoking accessories, such as Set designer. ? Clean your car and make sure to empty the ashtray. ? Clean your home, including curtains and carpets.  Tell your family, friends, and coworkers that you are quitting. Support from your loved ones can make quitting easier.  Talk with your health care provider about your options for quitting smoking.  Find out what treatment options are covered by your health insurance.  What strategies can I use to quit smoking? Talk with your healthcare provider about different strategies to quit smoking. Some strategies include:  Quitting smoking altogether instead of  gradually lessening how much you smoke over a period of  time. Research shows that quitting "cold [place]" is more successful than gradually quitting.  Attending in-person counseling to help you build problem-solving skills. You are more likely to have success in quitting if you attend several counseling sessions. Even short sessions of 10 minutes can be effective.  Finding resources and support systems that can help you to quit smoking and remain smoke-free after you quit. These resources are most helpful when you use them often. They can include: ? Online chats with a Veterinary surgeon. ? Telephone quitlines. ? Automotive engineer. ? Support groups or group counseling. ? Text messaging programs. ? Mobile phone applications.  Taking medicines to help you quit smoking. (If you are pregnant or breastfeeding, talk with your health care provider first.) Some medicines contain nicotine and some do not. Both types of medicines help with cravings, but the medicines that include nicotine help to relieve withdrawal symptoms. Your health care provider may recommend: ? Nicotine patches, gum, or lozenges. ? Nicotine inhalers or sprays. ? Non-nicotine medicine that is taken by mouth.  Talk with your health care provider about combining strategies, such as taking medicines while you are also receiving in-person counseling. Using these two strategies together makes you more likely to succeed in quitting than if you used either strategy on its own. If you are pregnant or breastfeeding, talk with your health care provider about finding counseling or other support strategies to quit smoking. Do not take medicine to help you quit smoking unless told to do so by your health care provider. What things can I do to make it easier to quit? Quitting smoking might feel overwhelming at first, but there is a lot that you can do to make it easier. Take these important actions:  Reach out to your family and friends and ask that they support and encourage you during this time.  Call telephone quitlines, reach out to support groups, or work with a counselor for support.  Ask people who smoke to avoid smoking around you.  Avoid places that trigger you to smoke, such as bars, parties, or smoke-break areas at work.  Spend time around people who do not smoke.  Lessen stress in your life, because stress can be a smoking trigger for some people. To lessen stress, try: ? Exercising regularly. ? Deep-breathing exercises. ? Yoga. ? Meditating. ? Performing a body scan. This involves closing your eyes, scanning your body from head to toe, and noticing which parts of your body are particularly tense. Purposefully relax the muscles in those areas.  Download or purchase mobile phone or tablet apps (applications) that can help you stick to your quit plan by providing reminders, tips, and encouragement. There are many free apps, such as QuitGuide from the [employer] developer for Disease Control and Prevention). You can find other support for quitting smoking (smoking cessation) through smokefree.gov and other websites.  How will I feel when I quit smoking? Within the first 24 hours of quitting smoking, you may start to feel some withdrawal symptoms. These symptoms are usually most noticeable 2-3 days after quitting, but they usually do not last beyond 2-3 weeks. Changes or symptoms that you might experience include:  Mood swings.  Restlessness, anxiety, or irritation.  Difficulty concentrating.  Dizziness.  Strong cravings for sugary foods in addition to nicotine.  Mild weight gain.  Constipation.  Nausea.  Coughing or a sore throat.  Changes in how your medicines work in your body.  A depressed mood.  Difficulty sleeping (insomnia).  After the first 2-3 weeks of quitting, you may start to notice more positive results, such as:  Improved sense of smell and taste.  Decreased coughing and sore throat.  Slower heart rate.  Lower blood pressure.  Clearer  skin.  The ability to breathe more easily.  Fewer sick days.  Quitting smoking is very challenging for most people. Do not get discouraged if you are not successful the first time. Some people need to make many attempts to quit before they achieve long-term success. Do your best to stick to your quit plan, and talk with your health care provider if you have any questions or concerns. This information is not intended to replace advice given to you by your health care provider. Make sure you discuss any questions you have with your health care provider. Document Released: 02/22/2001 Document Revised: 10/27/2015 Document Reviewed: 07/15/2014 Elsevier Interactive Patient Education  2018 [employer].    IF you received an x-ray today, you will receive an invoice from [facility]. Please contact [facility] at [phone] with questions or concerns regarding your invoice.   IF you received labwork today, you will receive an invoice from [place]. Please contact [facility] at [phone] with questions or concerns regarding your invoice.   Our billing staff will not be able to assist you with questions regarding bills from these companies.  You will be contacted with the lab results as soon as they are available. The fastest way to get your results is to activate your My Chart account. Instructions are located on the last page of this paperwork. If you have not heard from [place] regarding the results in 2 weeks, please contact this office.      I personally performed the services described in this documentation, which was scribed in my presence. The recorded information has been reviewed and considered for accuracy and completeness, addended by me as needed, and agree with information above.  Signed,   Meredith Staggers, MD Primary Care at [facility].  04/03/17 10:10 AM

## 2017-05-03 DIAGNOSIS — L732 Hidradenitis suppurativa: Secondary | ICD-10-CM | POA: Diagnosis not present

## 2017-05-03 DIAGNOSIS — Z79899 Other long term (current) drug therapy: Secondary | ICD-10-CM | POA: Diagnosis not present

## 2017-05-15 DIAGNOSIS — Z3202 Encounter for pregnancy test, result negative: Secondary | ICD-10-CM | POA: Diagnosis not present

## 2017-05-15 DIAGNOSIS — Z3042 Encounter for surveillance of injectable contraceptive: Secondary | ICD-10-CM | POA: Diagnosis not present

## 2017-05-15 DIAGNOSIS — Z6827 Body mass index (BMI) 27.0-27.9, adult: Secondary | ICD-10-CM | POA: Diagnosis not present

## 2017-05-30 DIAGNOSIS — Z79899 Other long term (current) drug therapy: Secondary | ICD-10-CM | POA: Diagnosis not present

## 2017-05-30 DIAGNOSIS — L732 Hidradenitis suppurativa: Secondary | ICD-10-CM | POA: Diagnosis not present

## 2017-08-29 DIAGNOSIS — L732 Hidradenitis suppurativa: Secondary | ICD-10-CM | POA: Diagnosis not present

## 2017-08-29 DIAGNOSIS — Z79899 Other long term (current) drug therapy: Secondary | ICD-10-CM | POA: Diagnosis not present

## 2017-10-31 DIAGNOSIS — Z3042 Encounter for surveillance of injectable contraceptive: Secondary | ICD-10-CM | POA: Diagnosis not present

## 2017-10-31 DIAGNOSIS — Z6826 Body mass index (BMI) 26.0-26.9, adult: Secondary | ICD-10-CM | POA: Diagnosis not present

## 2017-11-30 DIAGNOSIS — L732 Hidradenitis suppurativa: Secondary | ICD-10-CM | POA: Diagnosis not present

## 2017-12-04 ENCOUNTER — Ambulatory Visit: Payer: BLUE CROSS/BLUE SHIELD | Admitting: Family Medicine

## 2017-12-04 ENCOUNTER — Encounter: Payer: Self-pay | Admitting: Family Medicine

## 2017-12-04 ENCOUNTER — Other Ambulatory Visit: Payer: Self-pay

## 2017-12-04 VITALS — BP 130/91 | HR 100 | Temp 98.6°F | Ht 69.0 in | Wt 169.0 lb

## 2017-12-04 DIAGNOSIS — J029 Acute pharyngitis, unspecified: Secondary | ICD-10-CM

## 2017-12-04 DIAGNOSIS — R6889 Other general symptoms and signs: Secondary | ICD-10-CM | POA: Diagnosis not present

## 2017-12-04 DIAGNOSIS — N926 Irregular menstruation, unspecified: Secondary | ICD-10-CM | POA: Insufficient documentation

## 2017-12-04 DIAGNOSIS — L732 Hidradenitis suppurativa: Secondary | ICD-10-CM | POA: Insufficient documentation

## 2017-12-04 DIAGNOSIS — R809 Proteinuria, unspecified: Secondary | ICD-10-CM | POA: Insufficient documentation

## 2017-12-04 LAB — POC INFLUENZA A&B (BINAX/QUICKVUE)
Influenza A, POC: NEGATIVE
Influenza B, POC: NEGATIVE

## 2017-12-04 LAB — POCT RAPID STREP A (OFFICE): Rapid Strep A Screen: NEGATIVE

## 2017-12-04 NOTE — Progress Notes (Signed)
9/23/20192:34 PM  Jacqueline Kelly April 29, 1990, 27 y.o. female [id number]  Chief Complaint  Patient presents with  . Sore Throat    cough, sneezing, bodyaches, chills with some fever    HPI:   Patient is a 27 y.o. female with past medical history significant for immunosuppression who presents today for sore throat  2 days ago started having with sore throat, chills, subjective fevers Also having night sweats, body aches, and infrequent dry cough No SOB, intermittent ear pain, sinus pain No nausea, vomiting, diarrhea Has noticed white spots on her tonsils  Fall Risk  04/03/2017 12/27/2016 10/19/2016 09/18/2013  Falls in the past year? No No No No     Depression screen [facility] 2/9 04/03/2017 12/27/2016 10/19/2016  Decreased Interest 0 0 0  Down, Depressed, Hopeless 0 0 0  PHQ - 2 Score 0 0 0    No Known Allergies  Prior to Admission medications   Medication Sig Start Date End Date Taking? Authorizing Provider  Adalimumab (HUMIRA PEN) 40 MG/0.4ML PNKT Humira(CF) Pen 40 mg/0.4 mL subcutaneous kit    [provider]  Adalimumab 80 MG/0.8ML PNKT Humira(CF) Pen Crohn's-Ulc Colitis-Hid Sup Strt 80 mg/0.8 mL subcut kt    [provider]  ibuprofen (ADVIL,MOTRIN) 800 MG tablet Take 1 tablet (800 mg total) by mouth every 8 (eight) hours as needed. 09/18/13   Weber, Damaris Hippo, PA-C  medroxyPROGESTERone (DEPO-PROVERA) 150 MG/ML injection Inject 150 mg into the muscle every 3 (three) months.    [provider]  metFORMIN (GLUCOPHAGE-XR) 500 MG 24 hr tablet  11/30/17   [provider]  spironolactone (ALDACTONE) 100 MG tablet  11/30/17   [provider]    Past Medical History:  Diagnosis Date  . Allergy   . Hydradenitis    on humira per derm    History reviewed. No pertinent surgical history.  Social History   Tobacco Use  . Smoking status: Current Every Day Smoker    Packs/day: 0.50    Years: 1.00    Pack years: 0.50    Types: Cigarettes  .  Smokeless tobacco: Never Used  Substance Use Topics  . Alcohol use: Yes    Family History  Problem Relation Age of Onset  . Heart disease Paternal Grandmother   . Cancer Paternal Grandfather     ROS Per hpi  OBJECTIVE:  Blood pressure (!) 130/91, pulse 100, temperature 98.6 F (37 C), temperature source Oral, height '5\' 9"'  (1.753 m), weight 169 lb (76.7 kg), SpO2 97 %. Body mass index is 24.96 kg/m.   Physical Exam  Constitutional: She is oriented to person, place, and time. She appears well-developed and well-nourished. She appears ill.  HENT:  Head: Normocephalic and atraumatic.  Right Ear: Hearing, tympanic membrane, external ear and ear canal normal.  Left Ear: Hearing, tympanic membrane, external ear and ear canal normal.  Mouth/Throat: Uvula is midline and mucous membranes are normal. Posterior oropharyngeal erythema present. No posterior oropharyngeal edema. Tonsils are 2+ on the right. Tonsils are 2+ on the left. Tonsillar exudate.  Eyes: Pupils are equal, round, and reactive to light. Conjunctivae and EOM are normal.  Neck: Neck supple.  Cardiovascular: Normal rate, regular rhythm and normal heart sounds. Exam reveals no gallop and no friction rub.  No murmur heard. Pulmonary/Chest: Effort normal and breath sounds normal. She has no wheezes. She has no rales.  Musculoskeletal: She exhibits no edema.  Lymphadenopathy:    She has no cervical adenopathy.  Neurological: She is  alert and oriented to person, place, and time.  Skin: Skin is warm and dry.  Psychiatric: She has a normal mood and affect.  Nursing note and vitals reviewed.   Results for orders placed or performed in visit on 12/04/17 (from the past 24 hour(s))  POC Influenza A&B(BINAX/QUICKVUE)     Status: None   Collection Time: 12/04/17  2:30 PM  Result Value Ref Range   Influenza A, POC Negative Negative   Influenza B, POC Negative Negative  POCT rapid strep A     Status: None   Collection Time:  12/04/17  2:59 PM  Result Value Ref Range   Rapid Strep A Screen Negative Negative    ASSESSMENT and PLAN  1. Flu-like symptoms - POC Influenza A&B(BINAX/QUICKVUE)  2. Sore throat - POCT rapid strep A   Discussed supportive measures for URI: increase hydration, rest, OTC medications, etc. RTC precautions discussed.  Return if symptoms worsen or fail to improve.    Rutherford Guys, MD Primary Care at [facility] [place], [place] [address] Ph.  [phone] Fax [phone]

## 2017-12-04 NOTE — Patient Instructions (Signed)
Viral Illness, Adult Viruses are tiny germs that can get into a person's body and cause illness. There are many different types of viruses, and they cause many types of illness. Viral illnesses can range from mild to severe. They can affect various parts of the body. Common illnesses that are caused by a virus include colds and the flu. Viral illnesses also include serious conditions such as HIV/AIDS (human immunodeficiency virus/acquired immunodeficiency syndrome). A few viruses have been linked to certain cancers. What are the causes? Many types of viruses can cause illness. Viruses invade cells in your body, multiply, and cause the infected cells to malfunction or die. When the cell dies, it releases more of the virus. When this happens, you develop symptoms of the illness, and the virus continues to spread to other cells. If the virus takes over the function of the cell, it can cause the cell to divide and grow out of control, as is the case when a virus causes cancer. Different viruses get into the body in different ways. You can get a virus by:  Swallowing food or water that is contaminated with the virus.  Breathing in droplets that have been coughed or sneezed into the air by an infected person.  Touching a surface that has been contaminated with the virus and then touching your eyes, nose, or mouth.  Being bitten by an insect or animal that carries the virus.  Having sexual contact with a person who is infected with the virus.  Being exposed to blood or fluids that contain the virus, either through an open cut or during a transfusion.  If a virus enters your body, your body's defense system (immune system) will try to fight the virus. You may be at higher risk for a viral illness if your immune system is weak. What are the signs or symptoms? Symptoms vary depending on the type of virus and the location of the cells that it invades. Common symptoms of the main types of viral illnesses  include: Cold and flu viruses  Fever.  Headache.  Sore throat.  Muscle aches.  Nasal congestion.  Cough. Digestive system (gastrointestinal) viruses  Fever.  Abdominal pain.  Nausea.  Diarrhea. Liver viruses (hepatitis)  Loss of appetite.  Tiredness.  Yellowing of the skin (jaundice). Brain and spinal cord viruses  Fever.  Headache.  Stiff neck.  Nausea and vomiting.  Confusion or sleepiness. Skin viruses  Warts.  Itching.  Rash. Sexually transmitted viruses  Discharge.  Swelling.  Redness.  Rash. How is this treated? Viruses can be difficult to treat because they live within cells. Antibiotic medicines do not treat viruses because these drugs do not get inside cells. Treatment for a viral illness may include:  Resting and drinking plenty of fluids.  Medicines to relieve symptoms. These can include over-the-counter medicine for pain and fever, medicines for cough or congestion, and medicines to relieve diarrhea.  Antiviral medicines. These drugs are available only for certain types of viruses. They may help reduce flu symptoms if taken early. There are also many antiviral medicines for hepatitis and HIV/AIDS.  Some viral illnesses can be prevented with vaccinations. A common example is the flu shot. Follow these instructions at home: Medicines   Take over-the-counter and prescription medicines only as told by your health care provider.  If you were prescribed an antiviral medicine, take it as told by your health care provider. Do not stop taking the medicine even if you start to feel better.  Be aware   of when antibiotics are needed and when they are not needed. Antibiotics do not treat viruses. If your health care provider thinks that you may have a bacterial infection as well as a viral infection, you may get an antibiotic. ? Do not ask for an antibiotic prescription if you have been diagnosed with a viral illness. That will not make your  illness go away faster. ? Frequently taking antibiotics when they are not needed can lead to antibiotic resistance. When this develops, the medicine no longer works against the bacteria that it normally fights. General instructions  Drink enough fluids to keep your urine clear or pale yellow.  Rest as much as possible.  Return to your normal activities as told by your health care provider. Ask your health care provider what activities are safe for you.  Keep all follow-up visits as told by your health care provider. This is important. How is this prevented? Take these actions to reduce your risk of viral infection:  Eat a healthy diet and get enough rest.  Wash your hands often with soap and water. This is especially important when you are in public places. If soap and water are not available, use hand sanitizer.  Avoid close contact with friends and family who have a viral illness.  If you travel to areas where viral gastrointestinal infection is common, avoid drinking water or eating raw food.  Keep your immunizations up to date. Get a flu shot every year as told by your health care provider.  Do not share toothbrushes, nail clippers, razors, or needles with other people.  Always practice safe sex.  Contact a health care provider if:  You have symptoms of a viral illness that do not go away.  Your symptoms come back after going away.  Your symptoms get worse. Get help right away if:  You have trouble breathing.  You have a severe headache or a stiff neck.  You have severe vomiting or abdominal pain. This information is not intended to replace advice given to you by your health care provider. Make sure you discuss any questions you have with your health care provider. Document Released: 07/10/2015 Document Revised: 08/12/2015 Document Reviewed: 07/10/2015 Elsevier Interactive Patient Education  2018 Elsevier Inc.  

## 2018-01-05 ENCOUNTER — Ambulatory Visit (INDEPENDENT_AMBULATORY_CARE_PROVIDER_SITE_OTHER): Payer: BLUE CROSS/BLUE SHIELD

## 2018-01-05 ENCOUNTER — Other Ambulatory Visit: Payer: Self-pay

## 2018-01-05 ENCOUNTER — Encounter: Payer: Self-pay | Admitting: Family Medicine

## 2018-01-05 ENCOUNTER — Ambulatory Visit: Payer: BLUE CROSS/BLUE SHIELD | Admitting: Family Medicine

## 2018-01-05 VITALS — BP 119/82 | HR 106 | Temp 98.2°F | Ht 66.0 in | Wt 170.2 lb

## 2018-01-05 DIAGNOSIS — R059 Cough, unspecified: Secondary | ICD-10-CM

## 2018-01-05 DIAGNOSIS — R05 Cough: Secondary | ICD-10-CM

## 2018-01-05 DIAGNOSIS — R0982 Postnasal drip: Secondary | ICD-10-CM

## 2018-01-05 DIAGNOSIS — Z23 Encounter for immunization: Secondary | ICD-10-CM | POA: Diagnosis not present

## 2018-01-05 LAB — POCT CBC
Granulocyte percent: 52.2 %G (ref 37–80)
HCT, POC: 39.4 % (ref 29–41)
Hemoglobin: 13.3 g/dL (ref 9.5–13.5)
Lymph, poc: 3.4 (ref 0.6–3.4)
MCH, POC: 30.1 pg (ref 27–31.2)
MCHC: 33.8 g/dL (ref 31.8–35.4)
MCV: 89 fL (ref 76–111)
MID (cbc): 0.4 (ref 0–0.9)
MPV: 7.9 fL (ref 0–99.8)
POC Granulocyte: 4.2 (ref 2–6.9)
POC LYMPH PERCENT: 42.6 %L (ref 10–50)
POC MID %: 5.2 %M (ref 0–12)
Platelet Count, POC: 269 10*3/uL (ref 142–424)
RBC: 4.43 M/uL (ref 4.04–5.48)
RDW, POC: 13.6 %
WBC: 8 10*3/uL (ref 4.6–10.2)

## 2018-01-05 NOTE — Patient Instructions (Addendum)
   If you have lab work done today you will be contacted with your lab results within the next 2 weeks.  If you have not heard from us then please contact us. The fastest way to get your results is to register for My Chart.   IF you received an x-ray today, you will receive an invoice from [facility]. Please contact [facility] at [phone] with questions or concerns regarding your invoice.   IF you received labwork today, you will receive an invoice from [facility]. Please contact [facility] at [phone] with questions or concerns regarding your invoice.   Our billing staff will not be able to assist you with questions regarding bills from these companies.  You will be contacted with the lab results as soon as they are available. The fastest way to get your results is to activate your My Chart account. Instructions are located on the last page of this paperwork. If you have not heard from us regarding the results in 2 weeks, please contact this office.     Postnasal Drip Postnasal drip is the feeling of mucus going down the back of your throat. Mucus is a slimy substance that moistens and cleans your nose and throat, as well as the air pockets in face bones near your forehead and cheeks (sinuses). Small amounts of mucus pass from your nose and sinuses down the back of your throat all the time. This is normal. When you produce too much mucus or the mucus gets too thick, you can feel it. Some common causes of postnasal drip include:  Having more mucus because of: ? A cold or the flu. ? Allergies. ? Cold air. ? Certain medicines.  Having more mucus that is thicker because of: ? A sinus or nasal infection. ? Dry air. ? A food allergy.  Follow these instructions at home: Relieving discomfort  Gargle with a salt-water mixture 3-4 times a day or as needed. To make a salt-water mixture, completely dissolve -1 tsp of salt in 1 cup of warm water.  If the air in your  home is dry, use a humidifier to add moisture to the air.  Use a saline spray or container (neti pot) to flush out the nose (nasal irrigation). These methods can help clear away mucus and keep the nasal passages moist. General instructions  Take over-the-counter and prescription medicines only as told by your health care provider.  Follow instructions from your health care provider about eating or drinking restrictions. You may need to avoid caffeine.  Avoid things that you know you are allergic to (allergens), like dust, mold, pollen, pets, or certain foods.  Drink enough fluid to keep your urine pale yellow.  Keep all follow-up visits as told by your health care provider. This is important. Contact a health care provider if:  You have a fever.  You have a sore throat.  You have difficulty swallowing.  You have headache.  You have sinus pain.  You have a cough that does not go away.  The mucus from your nose becomes thick and is green or yellow in color.  You have cold or flu symptoms that last more than 10 days. Summary  Postnasal drip is the feeling of mucus going down the back of your throat.  If your health care provider approves, use nasal irrigation or a nasal spray 2?4 times a day.  Avoid things that you know you are allergic to (allergens), like dust, mold, pollen, pets, or certain foods.   This information is not intended to replace advice given to you by your health care provider. Make sure you discuss any questions you have with your health care provider. Document Released: 06/13/2016 Document Revised: 06/13/2016 Document Reviewed: 06/13/2016 Elsevier Interactive Patient Education  2018 Elsevier Inc.  

## 2018-01-05 NOTE — Addendum Note (Signed)
Addended by: Golden Hurter on: 01/05/2018 02:53 PM   Modules accepted: Orders

## 2018-01-05 NOTE — Progress Notes (Signed)
10/25/20192:02 PM  Jacqueline Kelly Dec 01, 1990, 27 y.o. female [id number]  Chief Complaint  Patient presents with  . Cough    for one month, mention that lung cancer is on the dad's side of the family    HPI:   Patient is a 27 y.o. female with past medical history significant for immunosupression who presents today for cough x 1 month  Saw 9/23 for viral illness Has continued with chronic cough, productive, at times with post-tussive emesis No hemoptysis, no SOB Still having nasal congestion, decreased rhinorrhea No sinus pain Feels PND Cough worse in the mornings and when it cold No h/o asthma No wheezing No sick contacts No fever, chills Smoking though less of recent    Fall Risk  01/05/2018 04/03/2017 12/27/2016 10/19/2016 09/18/2013  Falls in the past year? No No No No No     Depression screen [facility] 2/9 01/05/2018 04/03/2017 12/27/2016  Decreased Interest 0 0 0  Down, Depressed, Hopeless 0 0 0  PHQ - 2 Score 0 0 0    No Known Allergies  Prior to Admission medications   Medication Sig Start Date End Date Taking? Authorizing Provider  Adalimumab (HUMIRA PEN) 40 MG/0.4ML PNKT Humira(CF) Pen 40 mg/0.4 mL subcutaneous kit   Yes [provider]  Adalimumab 80 MG/0.8ML PNKT Humira(CF) Pen Crohn's-Ulc Colitis-Hid Sup Strt 80 mg/0.8 mL subcut kt   Yes [provider]  ibuprofen (ADVIL,MOTRIN) 800 MG tablet Take 1 tablet (800 mg total) by mouth every 8 (eight) hours as needed. 09/18/13  Yes Weber, Damaris Hippo, PA-C  medroxyPROGESTERone (DEPO-PROVERA) 150 MG/ML injection Inject 150 mg into the muscle every 3 (three) months.   Yes [provider]  metFORMIN (GLUCOPHAGE-XR) 500 MG 24 hr tablet  11/30/17  Yes [provider]  spironolactone (ALDACTONE) 100 MG tablet  11/30/17  Yes [provider]    Past Medical History:  Diagnosis Date  . Allergy   . Hydradenitis    on humira per derm    History reviewed. No pertinent surgical  history.  Social History   Tobacco Use  . Smoking status: Current Every Day Smoker    Packs/day: 0.50    Years: 1.00    Pack years: 0.50    Types: Cigarettes  . Smokeless tobacco: Never Used  Substance Use Topics  . Alcohol use: Yes    Family History  Problem Relation Age of Onset  . Heart disease Paternal Grandmother   . Cancer Paternal Grandfather     ROS Per hpi  OBJECTIVE:  Blood pressure 119/82, pulse (!) 106, temperature 98.2 F (36.8 C), temperature source Oral, height '5\' 6"'  (1.676 m), weight 170 lb 3.2 oz (77.2 kg), SpO2 99 %. Body mass index is 27.47 kg/m.   Physical Exam  Constitutional: She is oriented to person, place, and time. She appears well-developed and well-nourished.  HENT:  Head: Normocephalic and atraumatic.  Right Ear: Hearing, tympanic membrane, external ear and ear canal normal.  Left Ear: Hearing, tympanic membrane, external ear and ear canal normal.  Mouth/Throat: Oropharynx is clear and moist.  Eyes: Pupils are equal, round, and reactive to light. Conjunctivae and EOM are normal.  Neck: Neck supple.  Cardiovascular: Normal rate, regular rhythm and normal heart sounds. Exam reveals no gallop and no friction rub.  No murmur heard. Pulmonary/Chest: Effort normal and breath sounds normal. She has no wheezes. She has no rales.  Musculoskeletal: She exhibits no edema.  Lymphadenopathy:    She has no cervical adenopathy.  Neurological: She is alert and oriented to person, place, and time.  Skin: Skin is warm and dry.  Psychiatric: She has a normal mood and affect.  Nursing note and vitals reviewed.     Results for orders placed or performed in visit on 01/05/18 (from the past 24 hour(s))  POCT CBC     Status: None   Collection Time: 01/05/18  2:25 PM  Result Value Ref Range   WBC 8.0 4.6 - 10.2 K/uL   Lymph, poc 3.4 0.6 - 3.4   POC LYMPH PERCENT 42.6 10 - 50 %L   MID (cbc) 0.4 0 - 0.9   POC MID % 5.2 0 - 12 %M   POC Granulocyte 4.2 2  - 6.9   Granulocyte percent 52.2 37 - 80 %G   RBC 4.43 4.04 - 5.48 M/uL   Hemoglobin 13.3 9.5 - 13.5 g/dL   HCT, POC 39.4 29 - 41 %   MCV 89.0 76 - 111 fL   MCH, POC 30.1 27 - 31.2 pg   MCHC 33.8 31.8 - 35.4 g/dL   RDW, POC 13.6 %   Platelet Count, POC 269 142 - 424 K/uL   MPV 7.9 0 - 99.8 fL    Dg Chest 2 View  Result Date: 01/05/2018 CLINICAL DATA:  Cough 1 month. EXAM: CHEST - 2 VIEW COMPARISON:  None. FINDINGS: Lungs are adequately inflated and otherwise clear. Cardiomediastinal silhouette, bones and soft tissues are normal. IMPRESSION: No active cardiopulmonary disease. Electronically Signed   By: Marin Olp M.D.   On: 01/05/2018 14:14     ASSESSMENT and PLAN  1. Postnasal drip Discussed supportive measures, new meds r/se/b and RTC precautions. Patient educational handout given. 2. Cough - POCT CBC - DG Chest 2 View; Future  Return if symptoms worsen or fail to improve.    Rutherford Guys, MD Primary Care at [facility], [place] [address] Ph.  [phone] Fax [phone]

## 2018-01-23 DIAGNOSIS — Z3042 Encounter for surveillance of injectable contraceptive: Secondary | ICD-10-CM | POA: Diagnosis not present

## 2018-01-23 DIAGNOSIS — Z6826 Body mass index (BMI) 26.0-26.9, adult: Secondary | ICD-10-CM | POA: Diagnosis not present

## 2018-02-22 DIAGNOSIS — L732 Hidradenitis suppurativa: Secondary | ICD-10-CM | POA: Diagnosis not present

## 2018-03-27 DIAGNOSIS — L732 Hidradenitis suppurativa: Secondary | ICD-10-CM | POA: Diagnosis not present

## 2018-03-27 DIAGNOSIS — Z79899 Other long term (current) drug therapy: Secondary | ICD-10-CM | POA: Diagnosis not present

## 2018-04-16 DIAGNOSIS — L732 Hidradenitis suppurativa: Secondary | ICD-10-CM | POA: Diagnosis not present

## 2018-04-16 DIAGNOSIS — Z6827 Body mass index (BMI) 27.0-27.9, adult: Secondary | ICD-10-CM | POA: Diagnosis not present

## 2018-04-16 DIAGNOSIS — Z3009 Encounter for other general counseling and advice on contraception: Secondary | ICD-10-CM | POA: Diagnosis not present

## 2018-07-09 DIAGNOSIS — Z3042 Encounter for surveillance of injectable contraceptive: Secondary | ICD-10-CM | POA: Diagnosis not present

## 2018-09-18 ENCOUNTER — Encounter: Payer: Self-pay | Admitting: Family Medicine

## 2018-09-18 DIAGNOSIS — Z79899 Other long term (current) drug therapy: Secondary | ICD-10-CM | POA: Diagnosis not present

## 2018-09-18 DIAGNOSIS — Z111 Encounter for screening for respiratory tuberculosis: Secondary | ICD-10-CM | POA: Diagnosis not present

## 2018-09-18 DIAGNOSIS — L732 Hidradenitis suppurativa: Secondary | ICD-10-CM | POA: Diagnosis not present

## 2018-09-28 DIAGNOSIS — Z3042 Encounter for surveillance of injectable contraceptive: Secondary | ICD-10-CM | POA: Diagnosis not present

## 2018-10-16 ENCOUNTER — Telehealth (INDEPENDENT_AMBULATORY_CARE_PROVIDER_SITE_OTHER): Payer: BC Managed Care – PPO | Admitting: Emergency Medicine

## 2018-10-16 ENCOUNTER — Other Ambulatory Visit: Payer: Self-pay

## 2018-10-16 ENCOUNTER — Encounter: Payer: Self-pay | Admitting: Emergency Medicine

## 2018-10-16 DIAGNOSIS — R0789 Other chest pain: Secondary | ICD-10-CM | POA: Diagnosis not present

## 2018-10-16 DIAGNOSIS — R0781 Pleurodynia: Secondary | ICD-10-CM

## 2018-10-16 MED ORDER — MELOXICAM 7.5 MG PO TABS: 7.5000 mg | ORAL_TABLET | Freq: Every day | ORAL | 1 refills | Status: AC

## 2018-10-16 NOTE — Progress Notes (Signed)
Called patient to triage for appointment. Patient states she is having left rib pain, the pain is under the left breast for 3 days. Patient states when she lie down it hurts.

## 2018-10-16 NOTE — Progress Notes (Signed)
Telemedicine Encounter- SOAP NOTE Established Patient  This telephone encounter was conducted with the patient's (or proxy's) verbal consent via audio telecommunications: yes/no: Yes Patient was instructed to have this encounter in a suitably private space; and to only have persons present to whom they give permission to participate. In addition, patient identity was confirmed by use of name plus two identifiers (DOB and address).  I discussed the limitations, risks, security and privacy concerns of performing an evaluation and management service by telephone and the availability of in person appointments. I also discussed with the patient that there may be a patient responsible charge related to this service. The patient expressed understanding and agreed to proceed.    No chief complaint on file. Left-sided rib pain for 4 days  Subjective   Jacqueline Kelly is a 28 y.o. female established patient. Telephone visit today complaining of sharp steady pain to left rib cage area under left breast and extending laterally for the past 4 days.  Woke up with this pain Saturday morning.  Not associated with any other symptoms.  Denies flulike symptoms.  Denies injury.  Smoker.  Denies difficulty breathing, fever, chills, wheezing, or cough.  Tried over-the-counter analgesic without much relief.  HPI   Patient Active Problem List   Diagnosis Date Noted  . Irregular periods 12/04/2017  . Unspecified episodic mood disorder 09/04/2012  . ADHD (attention deficit hyperactivity disorder) 08/31/2012    Past Medical History:  Diagnosis Date  . Allergy   . Hydradenitis    on humira per derm    Current Outpatient Medications  Medication Sig Dispense Refill  . Adalimumab (HUMIRA PEN) 40 MG/0.4ML PNKT Humira(CF) Pen 40 mg/0.4 mL subcutaneous kit    . clindamycin (CLEOCIN) 300 MG capsule Take 300 mg by mouth 2 (two) times daily.    Marland Kitchen ibuprofen (ADVIL,MOTRIN) 800 MG tablet Take 1 tablet (800 mg  total) by mouth every 8 (eight) hours as needed. 30 tablet 0  . medroxyPROGESTERone (DEPO-PROVERA) 150 MG/ML injection Inject 150 mg into the muscle every 3 (three) months.    . rifampin (RIFADIN) 300 MG capsule Take 300 mg by mouth 2 (two) times daily.    . Adalimumab 80 MG/0.8ML PNKT Humira(CF) Pen Crohn's-Ulc Colitis-Hid Sup Strt 80 mg/0.8 mL subcut kt    . metFORMIN (GLUCOPHAGE-XR) 500 MG 24 hr tablet   0  . spironolactone (ALDACTONE) 100 MG tablet   1   No current facility-administered medications for this visit.     No Known Allergies  Social History   Socioeconomic History  . Marital status: Single    Spouse name: Not on file  . Number of children: Not on file  . Years of education: Not on file  . Highest education level: Not on file  Occupational History  . Not on file  Social Needs  . Financial resource strain: Not on file  . Food insecurity    Worry: Not on file    Inability: Not on file  . Transportation needs    Medical: Not on file    Non-medical: Not on file  Tobacco Use  . Smoking status: Current Every Day Smoker    Packs/day: 0.50    Years: 1.00    Pack years: 0.50    Types: Cigarettes  . Smokeless tobacco: Never Used  Substance and Sexual Activity  . Alcohol use: Yes  . Drug use: No  . Sexual activity: Yes    Birth control/protection: None  Lifestyle  . Physical  activity    Days per week: Not on file    Minutes per session: Not on file  . Stress: Not on file  Relationships  . Social Herbalist on phone: Not on file    Gets together: Not on file    Attends religious service: Not on file    Active member of club or organization: Not on file    Attends meetings of clubs or organizations: Not on file    Relationship status: Not on file  . Intimate partner violence    Fear of current or ex partner: Not on file    Emotionally abused: Not on file    Physically abused: Not on file    Forced sexual activity: Not on file  Other Topics  Concern  . Not on file  Social History Narrative   Single. Education: [employer].     Review of Systems  Constitutional: Negative.  Negative for chills and fever.  HENT: Negative.  Negative for congestion and sore throat.   Eyes: Negative.   Respiratory: Negative.  Negative for cough, shortness of breath and wheezing.   Cardiovascular: Negative.  Negative for palpitations and leg swelling.  Gastrointestinal: Negative.  Negative for abdominal pain, diarrhea, nausea and vomiting.  Genitourinary: Negative.  Negative for dysuria.  Skin: Negative.  Negative for rash.  Neurological: Negative.  Negative for dizziness and headaches.  Endo/Heme/Allergies: Negative.   All other systems reviewed and are negative.   Objective  Alert and oriented x3 in no apparent respiratory distress. Vitals as reported by the patient: There were no vitals filed for this visit.  Diagnoses and all orders for this visit:  Chest wall pain -     meloxicam (MOBIC) 7.5 MG tablet; Take 1 tablet (7.5 mg total) by mouth daily for 10 days.  Rib pain on left side -     meloxicam (MOBIC) 7.5 MG tablet; Take 1 tablet (7.5 mg total) by mouth daily for 10 days.    Clinically stable.  No red flag signs or symptoms.  Advised to use heat pad.  Will prescribe NSAID. Rest and avoid strenuous activities.  Follow-up in the office if no better next week.  I discussed the assessment and treatment plan with the patient. The patient was provided an opportunity to ask questions and all were answered. The patient agreed with the plan and demonstrated an understanding of the instructions.   The patient was advised to call back or seek an in-person evaluation if the symptoms worsen or if the condition fails to improve as anticipated.    Horald Pollen, MD  Primary Care at [facility]

## 2018-10-17 ENCOUNTER — Telehealth: Payer: BLUE CROSS/BLUE SHIELD | Admitting: Family Medicine

## 2018-10-26 ENCOUNTER — Encounter: Payer: Self-pay | Admitting: Registered Nurse

## 2018-10-26 ENCOUNTER — Other Ambulatory Visit: Payer: Self-pay

## 2018-10-26 ENCOUNTER — Ambulatory Visit (INDEPENDENT_AMBULATORY_CARE_PROVIDER_SITE_OTHER): Payer: BC Managed Care – PPO

## 2018-10-26 ENCOUNTER — Ambulatory Visit (INDEPENDENT_AMBULATORY_CARE_PROVIDER_SITE_OTHER): Payer: BC Managed Care – PPO | Admitting: Registered Nurse

## 2018-10-26 VITALS — BP 122/87 | HR 63 | Temp 98.0°F | Resp 16 | Wt 179.0 lb

## 2018-10-26 DIAGNOSIS — R0781 Pleurodynia: Secondary | ICD-10-CM

## 2018-10-26 MED ORDER — CYCLOBENZAPRINE HCL 5 MG PO TABS: 5.0000 mg | ORAL_TABLET | Freq: Three times a day (TID) | ORAL | 0 refills | Status: DC | PRN

## 2018-10-26 MED ORDER — TRAMADOL HCL 50 MG PO TABS: 50.0000 mg | ORAL_TABLET | Freq: Three times a day (TID) | ORAL | 0 refills | Status: AC | PRN

## 2018-10-26 NOTE — Patient Instructions (Signed)
° ° ° °  If you have lab work done today you will be contacted with your lab results within the next 2 weeks.  If you have not heard from us then please contact us. The fastest way to get your results is to register for My Chart. ° ° °IF you received an x-ray today, you will receive an invoice from [facility]. Please contact [facility] at [phone] with questions or concerns regarding your invoice.  ° °IF you received labwork today, you will receive an invoice from [facility]. Please contact [facility] at [phone] with questions or concerns regarding your invoice.  ° °Our billing staff will not be able to assist you with questions regarding bills from these companies. ° °You will be contacted with the lab results as soon as they are available. The fastest way to get your results is to activate your My Chart account. Instructions are located on the last page of this paperwork. If you have not heard from us regarding the results in 2 weeks, please contact this office. °  ° ° ° °

## 2018-10-26 NOTE — Progress Notes (Signed)
Established Patient Office Visit  Subjective:  Patient ID: Jacqueline Kelly, female    DOB: Jun 14, 1990  Age: 28 y.o. MRN: [id number]  CC:  Chief Complaint  Patient presents with  . Chest Pain    left side pain x 2 weeks     HPI Jacqueline Kelly presents for ongoing rib pain x 2 weeks.  Had been seen by Dr. Mitchel Honour for this issue last week, unfortunately, she has had minimal improvement and states that the meloxicam has not been effective for her. She states she was wrestling with her boyfriend, ensures [place] that it was friendly and no DV involved. She states that it hurts to take deep breaths and cough. Otherwise, no cough, mucus production, fever, chills, fatigue, shortness of breath  Past Medical History:  Diagnosis Date  . Allergy   . Hydradenitis    on humira per derm    History reviewed. No pertinent surgical history.  Family History  Problem Relation Age of Onset  . Heart disease Paternal Grandmother   . Cancer Paternal Grandfather   . Cancer Paternal Aunt     Social History   Socioeconomic History  . Marital status: Single    Spouse name: Not on file  . Number of children: Not on file  . Years of education: Not on file  . Highest education level: Not on file  Occupational History  . Not on file  Social Needs  . Financial resource strain: Not on file  . Food insecurity    Worry: Not on file    Inability: Not on file  . Transportation needs    Medical: Not on file    Non-medical: Not on file  Tobacco Use  . Smoking status: Current Every Day Smoker    Packs/day: 0.50    Years: 1.00    Pack years: 0.50    Types: Cigarettes  . Smokeless tobacco: Never Used  Substance and Sexual Activity  . Alcohol use: Yes  . Drug use: No  . Sexual activity: Yes    Birth control/protection: None  Lifestyle  . Physical activity    Days per week: Not on file    Minutes per session: Not on file  . Stress: Not on file  Relationships  . Social Herbalist on  phone: Not on file    Gets together: Not on file    Attends religious service: Not on file    Active member of club or organization: Not on file    Attends meetings of clubs or organizations: Not on file    Relationship status: Not on file  . Intimate partner violence    Fear of current or ex partner: Not on file    Emotionally abused: Not on file    Physically abused: Not on file    Forced sexual activity: Not on file  Other Topics Concern  . Not on file  Social History Narrative   Single. Education: [employer].     Outpatient Medications Prior to Visit  Medication Sig Dispense Refill  . Adalimumab 80 MG/0.8ML PNKT Humira(CF) Pen Crohn's-Ulc Colitis-Hid Sup Strt 80 mg/0.8 mL subcut kt    . clindamycin (CLEOCIN) 300 MG capsule Take 300 mg by mouth 2 (two) times daily.    Marland Kitchen ibuprofen (ADVIL,MOTRIN) 800 MG tablet Take 1 tablet (800 mg total) by mouth every 8 (eight) hours as needed. 30 tablet 0  . medroxyPROGESTERone (DEPO-PROVERA) 150 MG/ML injection Inject 150 mg into the muscle every 3 (  three) months.    . meloxicam (MOBIC) 7.5 MG tablet Take 1 tablet (7.5 mg total) by mouth daily for 10 days. 10 tablet 1  . rifampin (RIFADIN) 300 MG capsule Take 300 mg by mouth 2 (two) times daily.    . Adalimumab (HUMIRA PEN) 40 MG/0.4ML PNKT Humira(CF) Pen 40 mg/0.4 mL subcutaneous kit    . metFORMIN (GLUCOPHAGE-XR) 500 MG 24 hr tablet   0  . spironolactone (ALDACTONE) 100 MG tablet   1   No facility-administered medications prior to visit.     No Known Allergies  ROS Review of Systems Per hpi   Objective:    Physical Exam  Constitutional: She is oriented to person, place, and time. She appears well-developed and well-nourished. No distress.  Cardiovascular: Normal rate, regular rhythm and normal heart sounds.  Pulmonary/Chest: Effort normal and breath sounds normal.  Neurological: She is alert and oriented to person, place, and time.  Skin: Skin is warm and dry. No rash noted. She is  not diaphoretic. No erythema. No pallor.  Psychiatric: She has a normal mood and affect. Her behavior is normal. Judgment and thought content normal.  Nursing note and vitals reviewed. L ribs 7-9 TTP midclavicular line, radiates towards lateral aspect, not towards sternum  BP 122/87   Pulse 63   Temp 98 F (36.7 C) (Oral)   Resp 16   Wt 179 lb (81.2 kg)   SpO2 96%   BMI 28.89 kg/m  Wt Readings from Last 3 Encounters:  10/26/18 179 lb (81.2 kg)  01/05/18 170 lb 3.2 oz (77.2 kg)  12/04/17 169 lb (76.7 kg)     Health Maintenance Due  Topic Date Due  . TETANUS/TDAP  08/10/2011  . PAP-Cervical Cytology Screening  08/09/2013  . PAP SMEAR-Modifier  08/09/2013  . INFLUENZA VACCINE  10/13/2018    There are no preventive care reminders to display for this patient.  No results found for: TSH Lab Results  Component Value Date   WBC 8.0 01/05/2018   HGB 13.3 01/05/2018   HCT 39.4 01/05/2018   MCV 89.0 01/05/2018   Lab Results  Component Value Date   NA 139 10/19/2016   K 4.0 10/19/2016   CO2 21 10/19/2016   GLUCOSE 80 10/19/2016   BUN 9 10/19/2016   CREATININE 0.72 10/19/2016   BILITOT 0.7 10/19/2016   ALKPHOS 48 10/19/2016   AST 12 10/19/2016   ALT 10 10/19/2016   PROT 6.5 10/19/2016   ALBUMIN 4.3 10/19/2016   CALCIUM 9.3 10/19/2016   No results found for: CHOL No results found for: HDL No results found for: LDLCALC No results found for: TRIG No results found for: CHOLHDL No results found for: HGBA1C    Assessment & Plan:   Problem List Items Addressed This Visit    None    Visit Diagnoses    Rib pain on left side    -  Primary   Relevant Orders   DG Ribs Unilateral W/Chest Left (Completed)      No orders of the defined types were placed in this encounter.   Follow-up: No follow-ups on file.   PLAN  Xray onsite today negative  Likely a bruise vs. Muscle strain vs. Pleuritic pain, there is a chance the meloxicam was affected by her HS  antibiotics. Will try low doses of tramadol and flexeril in case there is accessory muscle inflammation - ideally, we want to help her sleep at this time, as we discussed this injuries can often  linger.  She has a CPE scheduled with Dr. Mitchel Honour next Tuesday, will discuss this with him then if there's no improvement  Patient encouraged to call clinic with any questions, comments, or concerns.   Maximiano Coss, NP

## 2018-10-29 ENCOUNTER — Telehealth: Payer: Self-pay | Admitting: Registered Nurse

## 2018-10-29 NOTE — Telephone Encounter (Signed)
Patient calling back to check status of this request.  

## 2018-10-29 NOTE — Telephone Encounter (Signed)
Pt is wanting a  Doctor's note for leaving work early on Friday. She would like this uploaded to [employer]. Please advise at [phone]

## 2018-10-30 ENCOUNTER — Other Ambulatory Visit: Payer: Self-pay

## 2018-10-30 ENCOUNTER — Encounter: Payer: Self-pay | Admitting: Emergency Medicine

## 2018-10-30 ENCOUNTER — Ambulatory Visit (INDEPENDENT_AMBULATORY_CARE_PROVIDER_SITE_OTHER): Payer: BC Managed Care – PPO | Admitting: Emergency Medicine

## 2018-10-30 VITALS — BP 118/76 | HR 100 | Temp 98.9°F | Resp 16 | Ht 66.0 in | Wt 181.0 lb

## 2018-10-30 DIAGNOSIS — Z0000 Encounter for general adult medical examination without abnormal findings: Secondary | ICD-10-CM

## 2018-10-30 DIAGNOSIS — Z872 Personal history of diseases of the skin and subcutaneous tissue: Secondary | ICD-10-CM

## 2018-10-30 DIAGNOSIS — Z1329 Encounter for screening for other suspected endocrine disorder: Secondary | ICD-10-CM

## 2018-10-30 DIAGNOSIS — Z130 Encounter for screening for diseases of the blood and blood-forming organs and certain disorders involving the immune mechanism: Secondary | ICD-10-CM

## 2018-10-30 DIAGNOSIS — Z13228 Encounter for screening for other metabolic disorders: Secondary | ICD-10-CM

## 2018-10-30 DIAGNOSIS — Z13220 Encounter for screening for lipoid disorders: Secondary | ICD-10-CM

## 2018-10-30 DIAGNOSIS — Z79899 Other long term (current) drug therapy: Secondary | ICD-10-CM | POA: Diagnosis not present

## 2018-10-30 DIAGNOSIS — L732 Hidradenitis suppurativa: Secondary | ICD-10-CM | POA: Diagnosis not present

## 2018-10-30 NOTE — Telephone Encounter (Signed)
Letter has been placed in my chart

## 2018-10-30 NOTE — Patient Instructions (Addendum)
   If you have lab work done today you will be contacted with your lab results within the next 2 weeks.  If you have not heard from us then please contact us. The fastest way to get your results is to register for My Chart.   IF you received an x-ray today, you will receive an invoice from [facility]. Please contact [facility] at [phone] with questions or concerns regarding your invoice.   IF you received labwork today, you will receive an invoice from [facility]. Please contact [facility] at [phone] with questions or concerns regarding your invoice.   Our billing staff will not be able to assist you with questions regarding bills from these companies.  You will be contacted with the lab results as soon as they are available. The fastest way to get your results is to activate your My Chart account. Instructions are located on the last page of this paperwork. If you have not heard from us regarding the results in 2 weeks, please contact this office.      Health Maintenance, Female Adopting a healthy lifestyle and getting preventive care are important in promoting health and wellness. Ask your health care provider about:  The right schedule for you to have regular tests and exams.  Things you can do on your own to prevent diseases and keep yourself healthy. What should I know about diet, weight, and exercise? Eat a healthy diet   Eat a diet that includes plenty of vegetables, fruits, low-fat dairy products, and lean protein.  Do not eat a lot of foods that are high in solid fats, added sugars, or sodium. Maintain a healthy weight Body mass index (BMI) is used to identify weight problems. It estimates body fat based on height and weight. Your health care provider can help determine your BMI and help you achieve or maintain a healthy weight. Get regular exercise Get regular exercise. This is one of the most important things you can do for your health. Most  adults should:  Exercise for at least 150 minutes each week. The exercise should increase your heart rate and make you sweat (moderate-intensity exercise).  Do strengthening exercises at least twice a week. This is in addition to the moderate-intensity exercise.  Spend less time sitting. Even light physical activity can be beneficial. Watch cholesterol and blood lipids Have your blood tested for lipids and cholesterol at 26 years of age, then have this test every 5 years. Have your cholesterol levels checked more often if:  Your lipid or cholesterol levels are high.  You are older than 26 years of age.  You are at high risk for heart disease. What should I know about cancer screening? Depending on your health history and family history, you may need to have cancer screening at various ages. This may include screening for:  Breast cancer.  Cervical cancer.  Colorectal cancer.  Skin cancer.  Lung cancer. What should I know about heart disease, diabetes, and high blood pressure? Blood pressure and heart disease  High blood pressure causes heart disease and increases the risk of stroke. This is more likely to develop in people who have high blood pressure readings, are of African descent, or are overweight.  Have your blood pressure checked: ? Every 3-5 years if you are 18-39 years of age. ? Every year if you are 40 years old or older. Diabetes Have regular diabetes screenings. This checks your fasting blood sugar level. Have the screening done:  Once every   three years after age 40 if you are at a normal weight and have a low risk for diabetes.  More often and at a younger age if you are overweight or have a high risk for diabetes. What should I know about preventing infection? Hepatitis B If you have a higher risk for hepatitis B, you should be screened for this virus. Talk with your health care provider to find out if you are at risk for hepatitis B infection. Hepatitis  C Testing is recommended for:  Everyone born from 1945 through 1965.  Anyone with known risk factors for hepatitis C. Sexually transmitted infections (STIs)  Get screened for STIs, including gonorrhea and chlamydia, if: ? You are sexually active and are younger than 26 years of age. ? You are older than 26 years of age and your health care provider tells you that you are at risk for this type of infection. ? Your sexual activity has changed since you were last screened, and you are at increased risk for chlamydia or gonorrhea. Ask your health care provider if you are at risk.  Ask your health care provider about whether you are at high risk for HIV. Your health care provider may recommend a prescription medicine to help prevent HIV infection. If you choose to take medicine to prevent HIV, you should first get tested for HIV. You should then be tested every 3 months for as long as you are taking the medicine. Pregnancy  If you are about to stop having your period (premenopausal) and you may become pregnant, seek counseling before you get pregnant.  Take 400 to 800 micrograms (mcg) of folic acid every day if you become pregnant.  Ask for birth control (contraception) if you want to prevent pregnancy. Osteoporosis and menopause Osteoporosis is a disease in which the bones lose minerals and strength with aging. This can result in bone fractures. If you are 65 years old or older, or if you are at risk for osteoporosis and fractures, ask your health care provider if you should:  Be screened for bone loss.  Take a calcium or vitamin D supplement to lower your risk of fractures.  Be given hormone replacement therapy (HRT) to treat symptoms of menopause. Follow these instructions at home: Lifestyle  Do not use any products that contain nicotine or tobacco, such as cigarettes, e-cigarettes, and chewing tobacco. If you need help quitting, ask your health care provider.  Do not use street  drugs.  Do not share needles.  Ask your health care provider for help if you need support or information about quitting drugs. Alcohol use  Do not drink alcohol if: ? Your health care provider tells you not to drink. ? You are pregnant, may be pregnant, or are planning to become pregnant.  If you drink alcohol: ? Limit how much you use to 0-1 drink a day. ? Limit intake if you are breastfeeding.  Be aware of how much alcohol is in your drink. In the [place] one drink equals one 12 oz bottle of beer (355 mL), one 5 oz glass of wine (148 mL), or one 1 oz glass of hard liquor (44 mL). General instructions  Schedule regular health, dental, and eye exams.  Stay current with your vaccines.  Tell your health care provider if: ? You often feel depressed. ? You have ever been abused or do not feel safe at home. Summary  Adopting a healthy lifestyle and getting preventive care are important in promoting health and wellness.    Follow your health care provider's instructions about healthy diet, exercising, and getting tested or screened for diseases.  Follow your health care provider's instructions on monitoring your cholesterol and blood pressure. This information is not intended to replace advice given to you by your health care provider. Make sure you discuss any questions you have with your health care provider. Document Released: 09/13/2010 Document Revised: 02/21/2018 Document Reviewed: 02/21/2018 Elsevier Patient Education  2020 [employer]  

## 2018-10-30 NOTE — Progress Notes (Signed)
Jacqueline Kelly 28 y.o.   Chief Complaint  Patient presents with  . Annual Exam    no pap    HISTORY OF PRESENT ILLNESS: This is a 28 y.o. female here for her annual exam.  Has no complaints or medical concerns today. Past medical history includes hidradenitis suppurativa, on Humira.  Had dermatologist appointment this morning and was referred to specialist at [facility]. Has GYN appointment for next September.  Will get Pap smear then. Smoker 5 to 10 cigarettes a day.  No EtOH user. Family history includes lung cancer.  Recent chest x-ray within normal limits.  Reviewed with patient in the room.  HPI   Prior to Admission medications   Medication Sig Start Date End Date Taking? Authorizing Provider  Adalimumab 80 MG/0.8ML PNKT Humira(CF) Pen Crohn's-Ulc Colitis-Hid Sup Strt 80 mg/0.8 mL subcut kt   Yes [provider]  clindamycin (CLEOCIN) 300 MG capsule Take 300 mg by mouth 2 (two) times daily.   Yes [provider]  cyclobenzaprine (FLEXERIL) 5 MG tablet Take 1 tablet (5 mg total) by mouth 3 (three) times daily as needed for muscle spasms. 10/26/18  Yes Janeece Agee, NP  ibuprofen (ADVIL,MOTRIN) 800 MG tablet Take 1 tablet (800 mg total) by mouth every 8 (eight) hours as needed. 09/18/13  Yes Weber, Dema Severin, PA-C  medroxyPROGESTERone (DEPO-PROVERA) 150 MG/ML injection Inject 150 mg into the muscle every 3 (three) months.   Yes [provider]  rifampin (RIFADIN) 300 MG capsule Take 300 mg by mouth 2 (two) times daily.   Yes [provider]  traMADol (ULTRAM) 50 MG tablet Take 1 tablet (50 mg total) by mouth every 8 (eight) hours as needed for up to 5 days. 10/26/18 10/31/18 Yes Janeece Agee, NP    No Known Allergies  Patient Active Problem List   Diagnosis Date Noted  . Irregular periods 12/04/2017  . ADHD (attention deficit hyperactivity disorder) 08/31/2012    Past Medical History:  Diagnosis Date  . Allergy   .  Hydradenitis    on humira per derm    History reviewed. No pertinent surgical history.  Social History   Socioeconomic History  . Marital status: Single    Spouse name: Not on file  . Number of children: Not on file  . Years of education: Not on file  . Highest education level: Not on file  Occupational History  . Not on file  Social Needs  . Financial resource strain: Not on file  . Food insecurity    Worry: Not on file    Inability: Not on file  . Transportation needs    Medical: Not on file    Non-medical: Not on file  Tobacco Use  . Smoking status: Current Every Day Smoker    Packs/day: 0.50    Years: 1.00    Pack years: 0.50    Types: Cigarettes  . Smokeless tobacco: Never Used  Substance and Sexual Activity  . Alcohol use: Yes  . Drug use: No  . Sexual activity: Yes    Birth control/protection: None  Lifestyle  . Physical activity    Days per week: Not on file    Minutes per session: Not on file  . Stress: Not on file  Relationships  . Social Musician on phone: Not on file    Gets together: Not on file    Attends religious service: Not on file    Active member of club  or organization: Not on file    Attends meetings of clubs or organizations: Not on file    Relationship status: Not on file  . Intimate partner violence    Fear of current or ex partner: Not on file    Emotionally abused: Not on file    Physically abused: Not on file    Forced sexual activity: Not on file  Other Topics Concern  . Not on file  Social History Narrative   Single. Education: [employer].     Family History  Problem Relation Age of Onset  . Heart disease Paternal Grandmother   . Cancer Paternal Grandfather   . Cancer Paternal Aunt      Review of Systems  Constitutional: Negative.  Negative for chills and fever.  HENT: Negative.   Eyes: Negative.   Respiratory: Negative.  Negative for cough and shortness of breath.   Cardiovascular: Negative.  Negative for  chest pain and palpitations.  Gastrointestinal: Negative for abdominal pain, blood in stool, melena, nausea and vomiting.  Genitourinary: Negative.  Negative for dysuria and hematuria.  Musculoskeletal: Negative.  Negative for back pain, myalgias and neck pain.  Skin:       Frequent abscesses in axillary and inguinal areas.  On chronic antibiotics.  Neurological: Negative.  Negative for dizziness and headaches.  Endo/Heme/Allergies: Negative.   All other systems reviewed and are negative.   Vitals:   10/30/18 1507  BP: 118/76  Pulse: 100  Resp: 16  Temp: 98.9 F (37.2 C)  SpO2: 96%    Physical Exam Vitals signs reviewed. Exam conducted with a chaperone present.  Constitutional:      Appearance: Normal appearance.  HENT:     Head: Normocephalic and atraumatic.     Nose: Nose normal.     Mouth/Throat:     Mouth: Mucous membranes are moist.     Pharynx: Oropharynx is clear.  Eyes:     Extraocular Movements: Extraocular movements intact.     Conjunctiva/sclera: Conjunctivae normal.     Pupils: Pupils are equal, round, and reactive to light.  Neck:     Musculoskeletal: Normal range of motion and neck supple. No muscular tenderness.  Cardiovascular:     Rate and Rhythm: Normal rate and regular rhythm.     Pulses: Normal pulses.     Heart sounds: Normal heart sounds.  Pulmonary:     Effort: Pulmonary effort is normal.     Breath sounds: Normal breath sounds.  Chest:     Breasts:        Right: Normal. No swelling, mass, nipple discharge, skin change or tenderness.        Left: Normal. No swelling, mass, nipple discharge, skin change or tenderness.  Abdominal:     General: Bowel sounds are normal. There is no distension.     Palpations: Abdomen is soft. There is no mass.     Tenderness: There is no abdominal tenderness. There is no right CVA tenderness or left CVA tenderness.  Musculoskeletal: Normal range of motion.     Right lower leg: No edema.     Left lower leg: No  edema.  Lymphadenopathy:     Cervical: No cervical adenopathy.  Skin:    General: Skin is warm and dry.  Neurological:     General: No focal deficit present.     Mental Status: She is alert and oriented to person, place, and time.  Psychiatric:        Mood and Affect: Mood normal.  Behavior: Behavior normal.      ASSESSMENT & PLAN: Jacqueline Kelly was seen today for annual exam.  Diagnoses and all orders for this visit:  Routine general medical examination at a health care facility  Screening for deficiency anemia -     CBC with Differential/Platelet  Screening for lipoid disorders -     Lipid panel  Screening for endocrine, metabolic and immunity disorder -     Comprehensive metabolic panel -     Hemoglobin A1c -     Lipid panel  History of hidradenitis suppurativa    Patient Instructions       If you have lab work done today you will be contacted with your lab results within the next 2 weeks.  If you have not heard from us then please contact us. The fastest way to get your results is to register for My Chart.   IF you received an x-ray today, you will receive an invoice from [facility]. Please contact [facility] at [phone] with questions or concerns regarding your invoice.   IF you received labwork today, you will receive an invoice from [facility]. Please contact [facility] at [phone] with questions or concerns regarding your invoice.   Our billing staff will not be able to assist you with questions regarding bills from these companies.  You will be contacted with the lab results as soon as they are available. The fastest way to get your results is to activate your My Chart account. Instructions are located on the last page of this paperwork. If you have not heard from us regarding the results in 2 weeks, please contact this office.     \  Health Maintenance, Female Adopting a healthy lifestyle and getting preventive care are  important in promoting health and wellness. Ask your health care provider about:  The right schedule for you to have regular tests and exams.  Things you can do on your own to prevent diseases and keep yourself healthy. What should I know about diet, weight, and exercise? Eat a healthy diet   Eat a diet that includes plenty of vegetables, fruits, low-fat dairy products, and lean protein.  Do not eat a lot of foods that are high in solid fats, added sugars, or sodium. Maintain a healthy weight Body mass index (BMI) is used to identify weight problems. It estimates body fat based on height and weight. Your health care provider can help determine your BMI and help you achieve or maintain a healthy weight. Get regular exercise Get regular exercise. This is one of the most important things you can do for your health. Most adults should:  Exercise for at least 150 minutes each week. The exercise should increase your heart rate and make you sweat (moderate-intensity exercise).  Do strengthening exercises at least twice a week. This is in addition to the moderate-intensity exercise.  Spend less time sitting. Even light physical activity can be beneficial. Watch cholesterol and blood lipids Have your blood tested for lipids and cholesterol at 26 years of age, then have this test every 5 years. Have your cholesterol levels checked more often if:  Your lipid or cholesterol levels are high.  You are older than 26 years of age.  You are at high risk for heart disease. What should I know about cancer screening? Depending on your health history and family history, you may need to have cancer screening at various ages. This may include screening for:  Breast cancer.  Cervical cancer.  Colorectal  cancer.  Skin cancer.  Lung cancer. What should I know about heart disease, diabetes, and high blood pressure? Blood pressure and heart disease  High blood pressure causes heart disease and  increases the risk of stroke. This is more likely to develop in people who have high blood pressure readings, are of African descent, or are overweight.  Have your blood pressure checked: ? Every 3-5 years if you are 8518-26 years of age. ? Every year if you are 514 years old or older. Diabetes Have regular diabetes screenings. This checks your fasting blood sugar level. Have the screening done:  Once every three years after age 26 if you are at a normal weight and have a low risk for diabetes.  More often and at a younger age if you are overweight or have a high risk for diabetes. What should I know about preventing infection? Hepatitis B If you have a higher risk for hepatitis B, you should be screened for this virus. Talk with your health care provider to find out if you are at risk for hepatitis B infection. Hepatitis C Testing is recommended for:  Everyone born from [birth year] through 1963.  Anyone with known risk factors for hepatitis C. Sexually transmitted infections (STIs)  Get screened for STIs, including gonorrhea and chlamydia, if: ? You are sexually active and are younger than 26 years of age. ? You are older than 26 years of age and your health care provider tells you that you are at risk for this type of infection. ? Your sexual activity has changed since you were last screened, and you are at increased risk for chlamydia or gonorrhea. Ask your health care provider if you are at risk.  Ask your health care provider about whether you are at high risk for HIV. Your health care provider may recommend a prescription medicine to help prevent HIV infection. If you choose to take medicine to prevent HIV, you should first get tested for HIV. You should then be tested every 3 months for as long as you are taking the medicine. Pregnancy  If you are about to stop having your period (premenopausal) and you may become pregnant, seek counseling before you get pregnant.  Take 400 to 800  micrograms (mcg) of folic acid every day if you become pregnant.  Ask for birth control (contraception) if you want to prevent pregnancy. Osteoporosis and menopause Osteoporosis is a disease in which the bones lose minerals and strength with aging. This can result in bone fractures. If you are 26 years old or older, or if you are at risk for osteoporosis and fractures, ask your health care provider if you should:  Be screened for bone loss.  Take a calcium or vitamin D supplement to lower your risk of fractures.  Be given hormone replacement therapy (HRT) to treat symptoms of menopause. Follow these instructions at home: Lifestyle  Do not use any products that contain nicotine or tobacco, such as cigarettes, e-cigarettes, and chewing tobacco. If you need help quitting, ask your health care provider.  Do not use street drugs.  Do not share needles.  Ask your health care provider for help if you need support or information about quitting drugs. Alcohol use  Do not drink alcohol if: ? Your health care provider tells you not to drink. ? You are pregnant, may be pregnant, or are planning to become pregnant.  If you drink alcohol: ? Limit how much you use to 0-1 drink a day. ? Limit intake  if you are breastfeeding.  Be aware of how much alcohol is in your drink. In the [place] one drink equals one 12 oz bottle of beer (355 mL), one 5 oz glass of wine (148 mL), or one 1 oz glass of hard liquor (44 mL). General instructions  Schedule regular health, dental, and eye exams.  Stay current with your vaccines.  Tell your health care provider if: ? You often feel depressed. ? You have ever been abused or do not feel safe at home. Summary  Adopting a healthy lifestyle and getting preventive care are important in promoting health and wellness.  Follow your health care provider's instructions about healthy diet, exercising, and getting tested or screened for diseases.  Follow your health  care provider's instructions on monitoring your cholesterol and blood pressure. This information is not intended to replace advice given to you by your health care provider. Make sure you discuss any questions you have with your health care provider. Document Released: 09/13/2010 Document Revised: 02/21/2018 Document Reviewed: 02/21/2018 Elsevier Patient Education  2020 Elsevier Inc.      Edwina BarthMiguel Haidar Muse, MD [facility]

## 2018-10-31 ENCOUNTER — Encounter: Payer: Self-pay | Admitting: Emergency Medicine

## 2018-10-31 LAB — COMPREHENSIVE METABOLIC PANEL
ALT: 18 IU/L (ref 0–32)
AST: 19 IU/L (ref 0–40)
Albumin/Globulin Ratio: 2.3 — ABNORMAL HIGH (ref 1.2–2.2)
Albumin: 4.6 g/dL (ref 3.9–5.0)
Alkaline Phosphatase: 58 IU/L (ref 39–117)
BUN/Creatinine Ratio: 12 (ref 9–23)
BUN: 9 mg/dL (ref 6–20)
Bilirubin Total: 0.4 mg/dL (ref 0.0–1.2)
CO2: 20 mmol/L (ref 20–29)
Calcium: 9.6 mg/dL (ref 8.7–10.2)
Chloride: 105 mmol/L (ref 96–106)
Creatinine, Ser: 0.77 mg/dL (ref 0.57–1.00)
GFR calc Af Amer: 123 mL/min/{1.73_m2} (ref >59)
GFR calc non Af Amer: 107 mL/min/{1.73_m2} (ref >59)
Globulin, Total: 2 g/dL (ref 1.5–4.5)
Glucose: 88 mg/dL (ref 65–99)
Potassium: 4.1 mmol/L (ref 3.5–5.2)
Sodium: 140 mmol/L (ref 134–144)
Total Protein: 6.6 g/dL (ref 6.0–8.5)

## 2018-10-31 LAB — CBC WITH DIFFERENTIAL/PLATELET
Basophils Absolute: 0 10*3/uL (ref 0.0–0.2)
Basos: 1 %
EOS (ABSOLUTE): 0.1 10*3/uL (ref 0.0–0.4)
Eos: 1 %
Hematocrit: 40.5 % (ref 34.0–46.6)
Hemoglobin: 14.1 g/dL (ref 11.1–15.9)
Immature Grans (Abs): 0 10*3/uL (ref 0.0–0.1)
Immature Granulocytes: 0 %
Lymphocytes Absolute: 2 10*3/uL (ref 0.7–3.1)
Lymphs: 30 %
MCH: 30.9 pg (ref 26.6–33.0)
MCHC: 34.8 g/dL (ref 31.5–35.7)
MCV: 89 fL (ref 79–97)
Monocytes Absolute: 0.4 10*3/uL (ref 0.1–0.9)
Monocytes: 6 %
Neutrophils Absolute: 4.3 10*3/uL (ref 1.4–7.0)
Neutrophils: 62 %
Platelets: 209 10*3/uL (ref 150–450)
RBC: 4.57 x10E6/uL (ref 3.77–5.28)
RDW: 12.5 % (ref 11.7–15.4)
WBC: 6.9 10*3/uL (ref 3.4–10.8)

## 2018-10-31 LAB — LIPID PANEL
Chol/HDL Ratio: 2.8 ratio (ref 0.0–4.4)
Cholesterol, Total: 152 mg/dL (ref 100–199)
HDL: 55 mg/dL (ref >39)
LDL Calculated: 82 mg/dL (ref 0–99)
Triglycerides: 77 mg/dL (ref 0–149)
VLDL Cholesterol Cal: 15 mg/dL (ref 5–40)

## 2018-10-31 LAB — HEMOGLOBIN A1C
Est. average glucose Bld gHb Est-mCnc: 100 mg/dL
Hgb A1c MFr Bld: 5.1 % (ref 4.8–5.6)

## 2018-11-15 DIAGNOSIS — Z124 Encounter for screening for malignant neoplasm of cervix: Secondary | ICD-10-CM | POA: Diagnosis not present

## 2018-11-15 DIAGNOSIS — Z01419 Encounter for gynecological examination (general) (routine) without abnormal findings: Secondary | ICD-10-CM | POA: Diagnosis not present

## 2018-11-15 DIAGNOSIS — Z3202 Encounter for pregnancy test, result negative: Secondary | ICD-10-CM | POA: Diagnosis not present

## 2018-11-15 DIAGNOSIS — Z113 Encounter for screening for infections with a predominantly sexual mode of transmission: Secondary | ICD-10-CM | POA: Diagnosis not present

## 2018-11-15 DIAGNOSIS — Z6827 Body mass index (BMI) 27.0-27.9, adult: Secondary | ICD-10-CM | POA: Diagnosis not present

## 2018-11-15 LAB — HM PAP SMEAR

## 2018-11-21 ENCOUNTER — Encounter: Payer: Self-pay | Admitting: *Deleted

## 2018-12-12 ENCOUNTER — Encounter: Payer: Self-pay | Admitting: Emergency Medicine

## 2018-12-12 ENCOUNTER — Telehealth (INDEPENDENT_AMBULATORY_CARE_PROVIDER_SITE_OTHER): Payer: BC Managed Care – PPO | Admitting: Emergency Medicine

## 2018-12-12 ENCOUNTER — Other Ambulatory Visit: Payer: Self-pay

## 2018-12-12 VITALS — Ht 66.0 in | Wt 155.0 lb

## 2018-12-12 DIAGNOSIS — Z872 Personal history of diseases of the skin and subcutaneous tissue: Secondary | ICD-10-CM | POA: Diagnosis not present

## 2018-12-12 DIAGNOSIS — R21 Rash and other nonspecific skin eruption: Secondary | ICD-10-CM | POA: Diagnosis not present

## 2018-12-12 MED ORDER — TRAMADOL HCL 50 MG PO TABS: 50.0000 mg | ORAL_TABLET | Freq: Three times a day (TID) | ORAL | 0 refills | Status: AC | PRN

## 2018-12-12 MED ORDER — DOXYCYCLINE HYCLATE 100 MG PO TABS: 100.0000 mg | ORAL_TABLET | Freq: Two times a day (BID) | ORAL | 0 refills | Status: AC

## 2018-12-12 NOTE — Progress Notes (Signed)
Called patient to triage for appointment. Patient is complaining of bumps on the right leg from knee up and itching off and on for 6 days.

## 2018-12-12 NOTE — Progress Notes (Signed)
Telemedicine Encounter- SOAP NOTE Established Patient  This video telephone through [email or web address] encounter was conducted with the patient's (or proxy's) verbal consent via video audio telecommunications: yes/no: Yes Patient was instructed to have this encounter in a suitably private space; and to only have persons present to whom they give permission to participate. In addition, patient identity was confirmed by use of name plus two identifiers (DOB and address).  I discussed the limitations, risks, security and privacy concerns of performing an evaluation and management service by telephone and the availability of in person appointments. I also discussed with the patient that there may be a patient responsible charge related to this service. The patient expressed understanding and agreed to proceed.  I spent a total of TIME; 0 MIN TO 60 MIN: 15 minutes talking with the patient or their proxy.  No chief complaint on file. Red bumps on the right leg  Subjective   Jacqueline Kelly is a 28 y.o. female established patient. Telephone visit today complaining of red bumpy rash to her right leg that started last week.  Denies injuries or insect bites.  Has a history of hidradenitis suppurativa.  No other associated symptoms.   HPI   Patient Active Problem List   Diagnosis Date Noted  . Irregular periods 12/04/2017    Past Medical History:  Diagnosis Date  . Allergy   . Hydradenitis    on humira per derm    Current Outpatient Medications  Medication Sig Dispense Refill  . Adalimumab 80 MG/0.8ML PNKT Humira(CF) Pen Crohn's-Ulc Colitis-Hid Sup Strt 80 mg/0.8 mL subcut kt    . ibuprofen (ADVIL,MOTRIN) 800 MG tablet Take 1 tablet (800 mg total) by mouth every 8 (eight) hours as needed. 30 tablet 0  . medroxyPROGESTERone (DEPO-PROVERA) 150 MG/ML injection Inject 150 mg into the muscle every 3 (three) months.    . clindamycin (CLEOCIN) 300 MG capsule Take 300 mg by mouth 2 (two) times daily.     . cyclobenzaprine (FLEXERIL) 5 MG tablet Take 1 tablet (5 mg total) by mouth 3 (three) times daily as needed for muscle spasms. (Patient not taking: Reported on 12/12/2018) 20 tablet 0  . doxycycline (VIBRA-TABS) 100 MG tablet Take 1 tablet (100 mg total) by mouth 2 (two) times daily for 7 days. 14 tablet 0  . rifampin (RIFADIN) 300 MG capsule Take 300 mg by mouth 2 (two) times daily.    . traMADol (ULTRAM) 50 MG tablet Take 1 tablet (50 mg total) by mouth every 8 (eight) hours as needed for up to 5 days. 12 tablet 0   No current facility-administered medications for this visit.     Not on File  Social History   Socioeconomic History  . Marital status: Single    Spouse name: Not on file  . Number of children: Not on file  . Years of education: Not on file  . Highest education level: Not on file  Occupational History  . Not on file  Social Needs  . Financial resource strain: Not on file  . Food insecurity    Worry: Not on file    Inability: Not on file  . Transportation needs    Medical: Not on file    Non-medical: Not on file  Tobacco Use  . Smoking status: Current Every Day Smoker    Packs/day: 0.50    Years: 1.00    Pack years: 0.50    Types: Cigarettes  . Smokeless tobacco: Never Used  Substance and Sexual  Activity  . Alcohol use: Yes  . Drug use: No  . Sexual activity: Yes    Birth control/protection: None  Lifestyle  . Physical activity    Days per week: Not on file    Minutes per session: Not on file  . Stress: Not on file  Relationships  . Social Musician on phone: Not on file    Gets together: Not on file    Attends religious service: Not on file    Active member of club or organization: Not on file    Attends meetings of clubs or organizations: Not on file    Relationship status: Not on file  . Intimate partner violence    Fear of current or ex partner: Not on file    Emotionally abused: Not on file    Physically abused: Not on file     Forced sexual activity: Not on file  Other Topics Concern  . Not on file  Social History Narrative   Single. Education: [employer].     Review of Systems  Constitutional: Negative.  Negative for chills and fever.  HENT: Negative for congestion and sore throat.   Respiratory: Negative.  Negative for cough and shortness of breath.   Cardiovascular: Negative.  Negative for chest pain and palpitations.  Gastrointestinal: Negative.  Negative for abdominal pain, diarrhea, nausea and vomiting.  Genitourinary: Negative.   Musculoskeletal: Negative.  Negative for back pain, myalgias and neck pain.  Skin: Positive for itching and rash.  Neurological: Negative for dizziness and headaches.  All other systems reviewed and are negative.   Objective  Alert and oriented x3 in no apparent respiratory distress Vitals as reported by the patient: Today's Vitals   12/12/18 1339  Weight: 155 lb (70.3 kg)  Height: 5\' 6"  (1.676 m)    Diagnoses and all orders for this visit:  Rash and nonspecific skin eruption -     doxycycline (VIBRA-TABS) 100 MG tablet; Take 1 tablet (100 mg total) by mouth 2 (two) times daily for 7 days.  History of hidradenitis suppurativa -     traMADol (ULTRAM) 50 MG tablet; Take 1 tablet (50 mg total) by mouth every 8 (eight) hours as needed for up to 5 days.  Clinically stable.  No red flag signs or symptoms.  Advised to contact the office if no better after 1 week.   I discussed the assessment and treatment plan with the patient. The patient was provided an opportunity to ask questions and all were answered. The patient agreed with the plan and demonstrated an understanding of the instructions.   The patient was advised to call back or seek an in-person evaluation if the symptoms worsen or if the condition fails to improve as anticipated.  I provided 15 minutes of non-face-to-face time during this encounter.  , MD  Primary Care at [facility], [place]

## 2018-12-21 DIAGNOSIS — Z3042 Encounter for surveillance of injectable contraceptive: Secondary | ICD-10-CM | POA: Diagnosis not present

## 2019-02-25 DIAGNOSIS — L732 Hidradenitis suppurativa: Secondary | ICD-10-CM | POA: Diagnosis not present

## 2019-03-14 DIAGNOSIS — Z3042 Encounter for surveillance of injectable contraceptive: Secondary | ICD-10-CM | POA: Diagnosis not present

## 2019-05-06 DIAGNOSIS — L732 Hidradenitis suppurativa: Secondary | ICD-10-CM | POA: Diagnosis not present

## 2019-05-06 DIAGNOSIS — Z79899 Other long term (current) drug therapy: Secondary | ICD-10-CM | POA: Diagnosis not present

## 2019-05-06 DIAGNOSIS — L910 Hypertrophic scar: Secondary | ICD-10-CM | POA: Diagnosis not present

## 2019-06-06 DIAGNOSIS — Z3040 Encounter for surveillance of contraceptives, unspecified: Secondary | ICD-10-CM | POA: Diagnosis not present

## 2019-06-15 ENCOUNTER — Ambulatory Visit: Payer: BC Managed Care – PPO | Attending: Internal Medicine

## 2019-06-15 DIAGNOSIS — Z23 Encounter for immunization: Secondary | ICD-10-CM

## 2019-06-15 NOTE — Progress Notes (Signed)
   Covid-19 Vaccination Clinic  Name:  Jacqueline Kelly    MRN: [id number] DOB: 1990/09/29  06/15/2019  Ms. [name] was observed post Covid-19 immunization for 15 minutes without incident. She was provided with Vaccine Information Sheet and instruction to access the V-Safe system.   Ms. [name] was instructed to call 911 with any severe reactions post vaccine: Marland Kitchen Difficulty breathing  . Swelling of face and throat  . A fast heartbeat  . A bad rash all over body  . Dizziness and weakness   Immunizations Administered    Name Date Dose VIS Date Route   Pfizer COVID-19 Vaccine 06/15/2019  8:19 AM 0.3 mL 02/22/2019 Intramuscular   Manufacturer: [employer], [employer]   Lot: [id number]   NDC: [address]-1

## 2019-07-10 ENCOUNTER — Ambulatory Visit: Payer: BC Managed Care – PPO | Attending: Internal Medicine

## 2019-07-10 DIAGNOSIS — Z23 Encounter for immunization: Secondary | ICD-10-CM

## 2019-07-10 NOTE — Progress Notes (Signed)
   Covid-19 Vaccination Clinic  Name:  Jacqueline Kelly    MRN: [id number] DOB: 22-Feb-1991  07/10/2019  [name] was observed post Covid-19 immunization for 15 minutes without incident. She was provided with Vaccine Information Sheet and instruction to access the V-Safe system.   [name] was instructed to call 911 with any severe reactions post vaccine: Marland Kitchen Difficulty breathing  . Swelling of face and throat  . A fast heartbeat  . A bad rash all over body  . Dizziness and weakness   Immunizations Administered    Name Date Dose VIS Date Route   Pfizer COVID-19 Vaccine 07/10/2019  4:29 PM 0.3 mL 05/08/2018 Intramuscular   Manufacturer: [employer], [employer]   Lot: [id number]   NDC: [address]-2

## 2019-08-29 DIAGNOSIS — Z3042 Encounter for surveillance of injectable contraceptive: Secondary | ICD-10-CM | POA: Diagnosis not present

## 2019-10-28 DIAGNOSIS — Z79899 Other long term (current) drug therapy: Secondary | ICD-10-CM | POA: Diagnosis not present

## 2019-10-28 DIAGNOSIS — L910 Hypertrophic scar: Secondary | ICD-10-CM | POA: Diagnosis not present

## 2019-10-28 DIAGNOSIS — L732 Hidradenitis suppurativa: Secondary | ICD-10-CM | POA: Diagnosis not present

## 2019-11-21 DIAGNOSIS — Z3042 Encounter for surveillance of injectable contraceptive: Secondary | ICD-10-CM | POA: Diagnosis not present

## 2019-12-09 DIAGNOSIS — Z01419 Encounter for gynecological examination (general) (routine) without abnormal findings: Secondary | ICD-10-CM | POA: Diagnosis not present

## 2020-02-13 DIAGNOSIS — Z3042 Encounter for surveillance of injectable contraceptive: Secondary | ICD-10-CM | POA: Diagnosis not present

## 2020-05-08 DIAGNOSIS — Z3042 Encounter for surveillance of injectable contraceptive: Secondary | ICD-10-CM | POA: Diagnosis not present

## 2020-06-11 IMAGING — DX LEFT RIBS AND CHEST - 3+ VIEW
4 series · 4 of 4 positions shown · non-contrast
Comparison: Chest x-ray dated January 05, 2018.

CLINICAL DATA: Left-sided rib pain.

EXAM:
LEFT RIBS AND CHEST - 3+ VIEW

[chest pa]
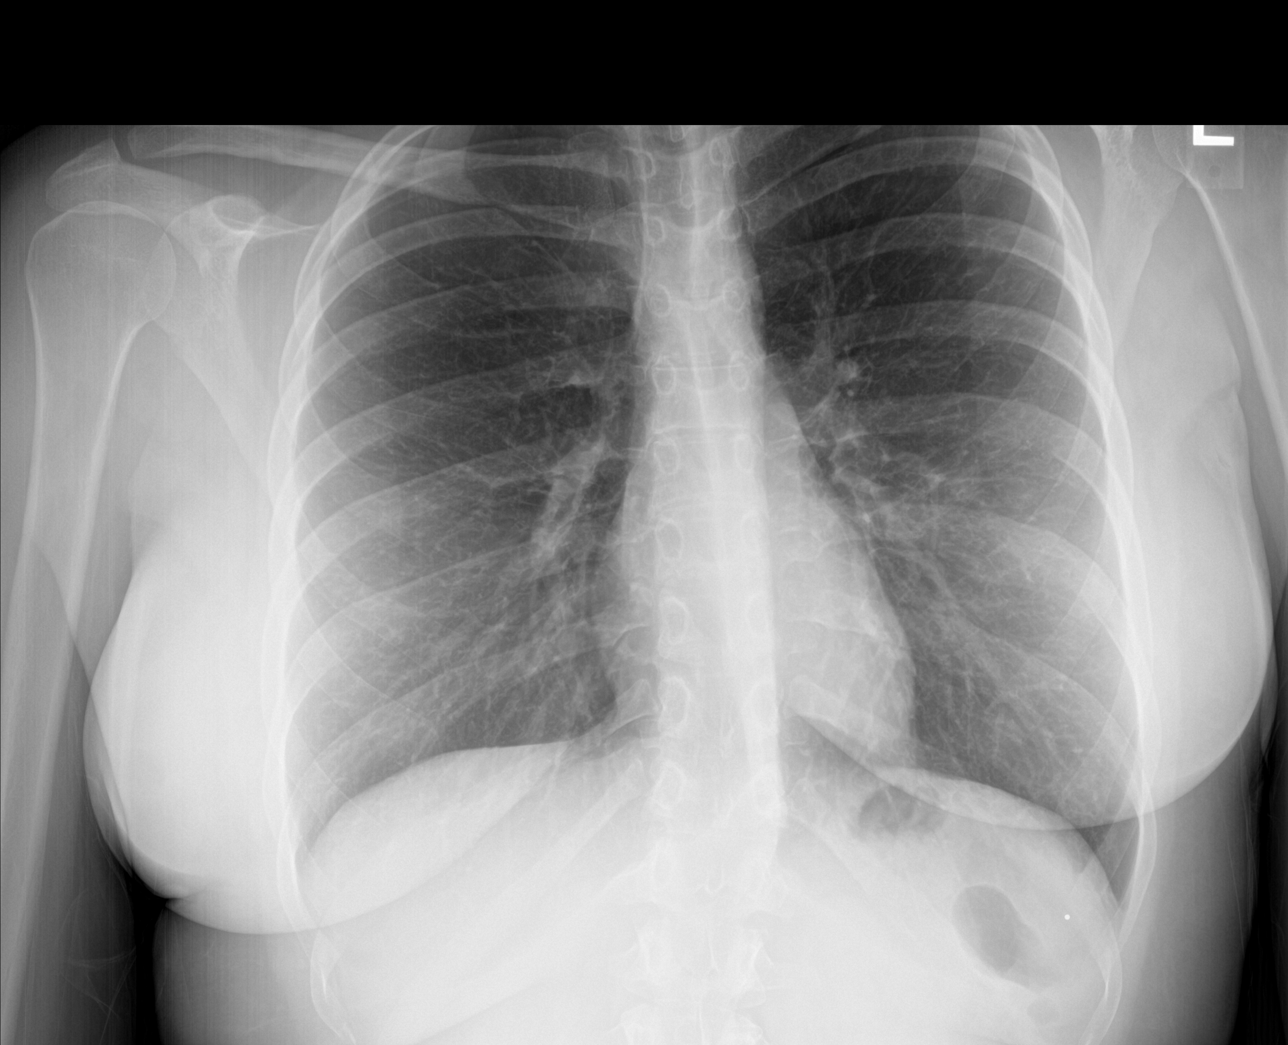

[rib obl (1 of 2)]
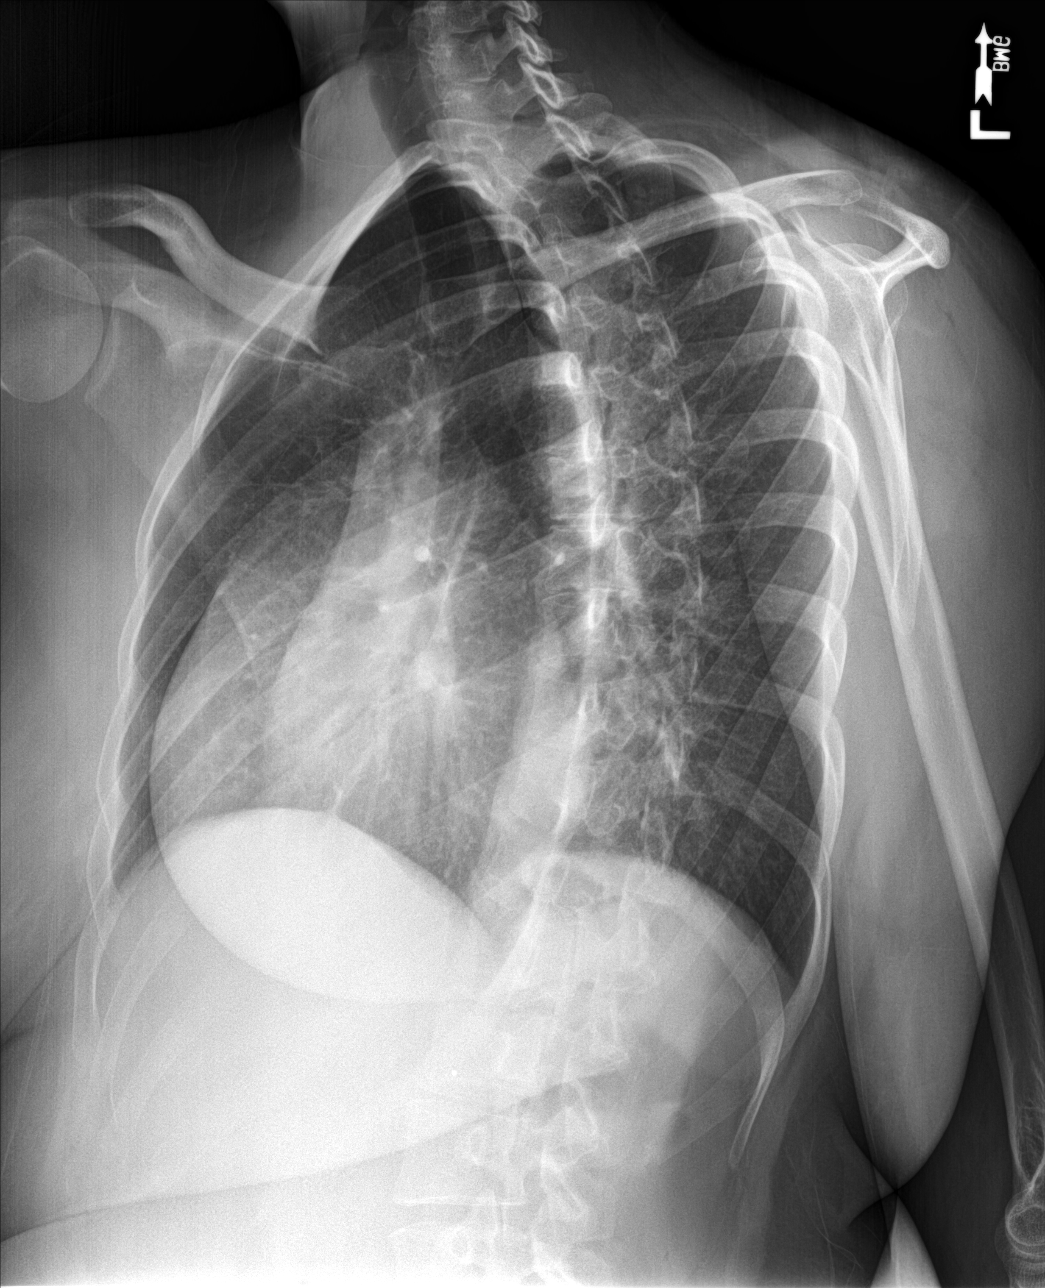

[rib obl (2 of 2)]
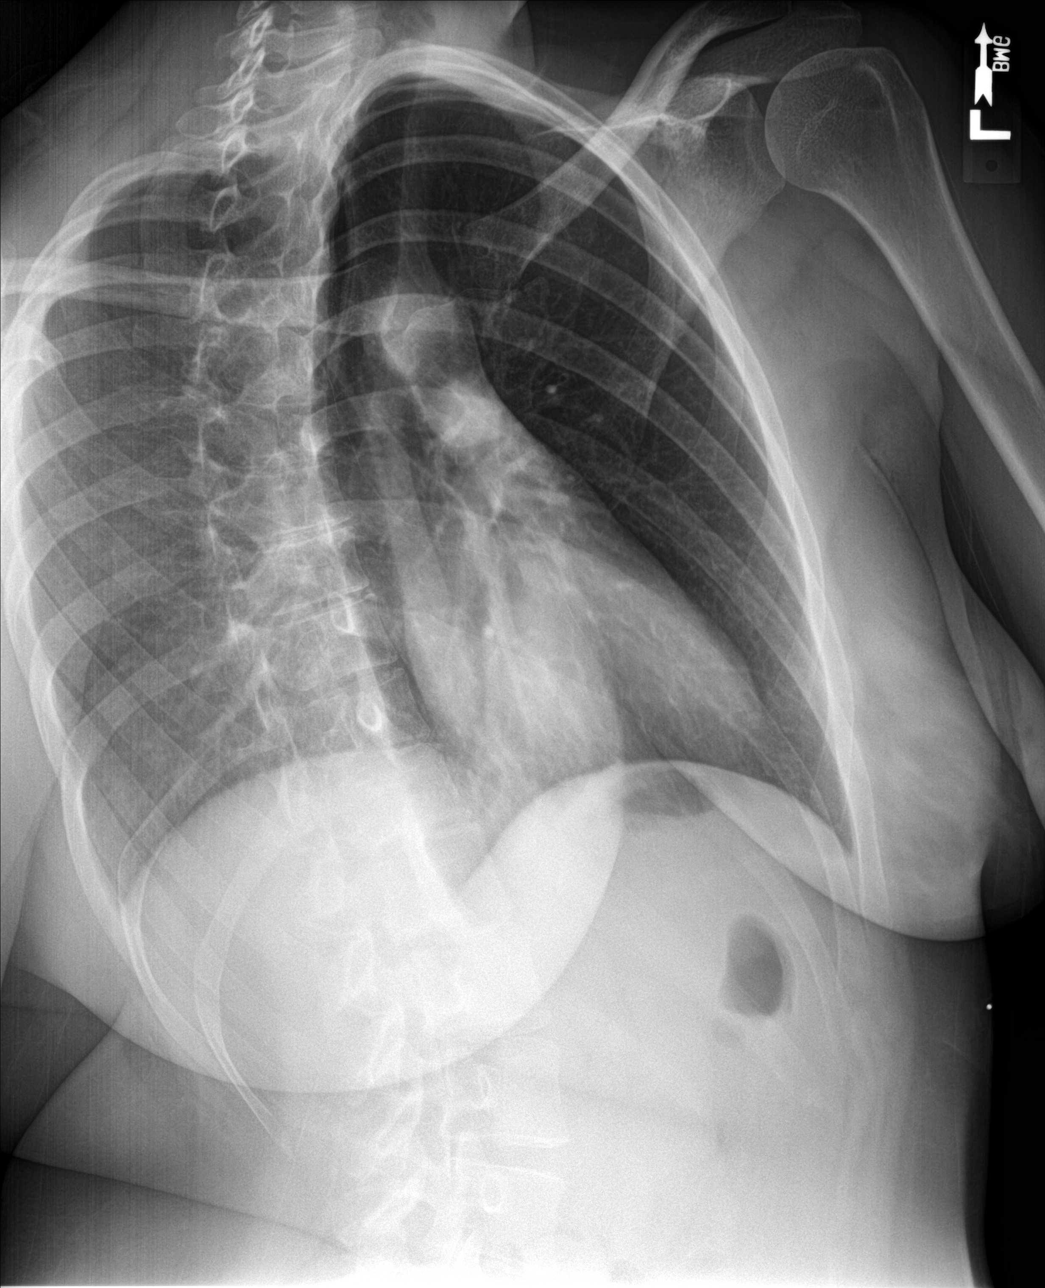

[chest ap]
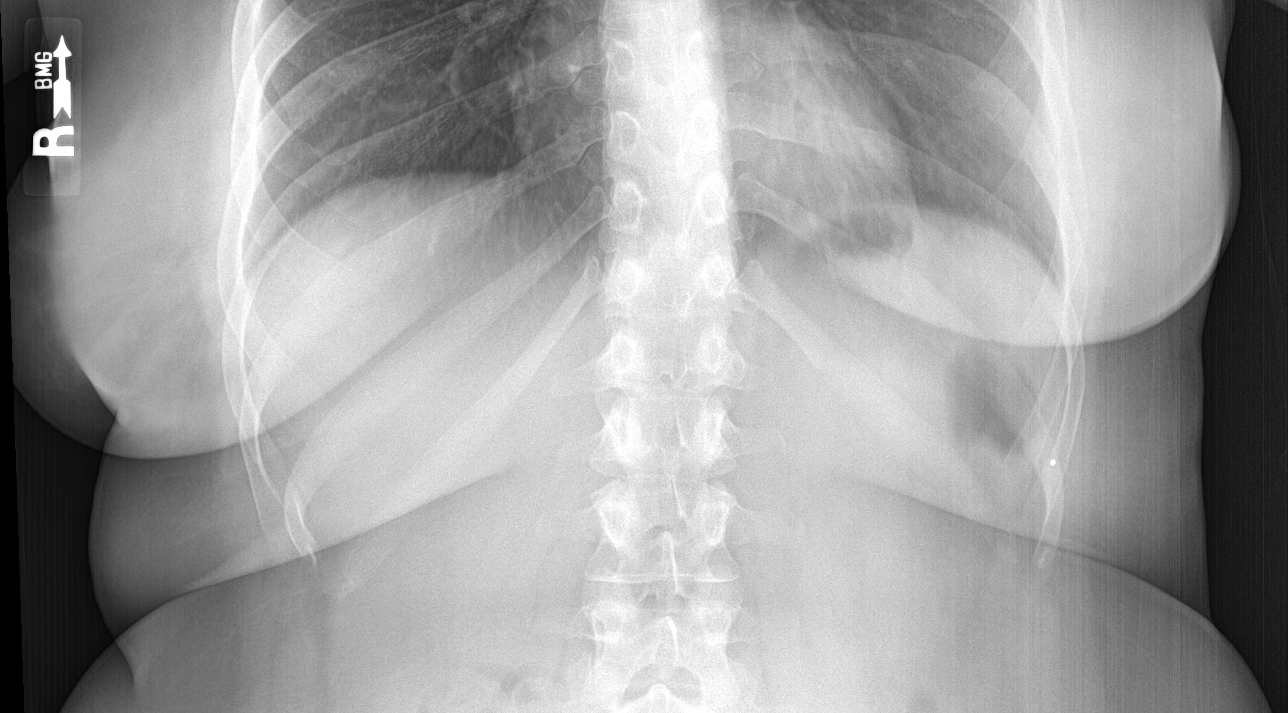

[4 of 4 positions shown; findings below may reference images not displayed]

FINDINGS: No fracture or other bone lesions are seen involving the ribs. There
is no evidence of pneumothorax or pleural effusion. Both lungs are
clear. Heart size and mediastinal contours are within normal limits.
IMPRESSION: Negative.

## 2020-06-22 ENCOUNTER — Telehealth (INDEPENDENT_AMBULATORY_CARE_PROVIDER_SITE_OTHER): Payer: BC Managed Care – PPO | Admitting: Emergency Medicine

## 2020-06-22 ENCOUNTER — Encounter: Payer: Self-pay | Admitting: Emergency Medicine

## 2020-06-22 DIAGNOSIS — R112 Nausea with vomiting, unspecified: Secondary | ICD-10-CM | POA: Diagnosis not present

## 2020-06-22 DIAGNOSIS — B349 Viral infection, unspecified: Secondary | ICD-10-CM

## 2020-06-22 MED ORDER — ONDANSETRON 4 MG PO TBDP: 4.0000 mg | ORAL_TABLET | Freq: Three times a day (TID) | ORAL | 1 refills | Status: DC | PRN

## 2020-06-22 NOTE — Progress Notes (Addendum)
Telemedicine Encounter- SOAP NOTE Established Patient Patient: Home  Provider: Office   patient present only for this visit   This telephone encounter was conducted with the patient's (or proxy's) verbal consent via audio telecommunications: yes/no: Yes Patient was instructed to have this encounter in a suitably private space; and to only have persons present to whom they give permission to participate. In addition, patient identity was confirmed by use of name plus two identifiers (DOB and address).  I discussed the limitations, risks, security and privacy concerns of performing an evaluation and management service by telephone and the availability of in person appointments. I also discussed with the patient that there may be a patient responsible charge related to this service. The patient expressed understanding and agreed to proceed.  I spent a total of TIME; 0 MIN TO 60 MIN: 20 minutes talking with the patient or their proxy.  No chief complaint on file. Nausea and vomiting  Subjective   Jacqueline Kelly is a 30 y.o. female established patient. Telephone visit today complaining of feeling sick since yesterday.  Started with vomiting and feeling very tired, achy legs, chills, hot flashes, headache, and watery nonbloody diarrhea.  Fully vaccinated against COVID with a booster.  Home Covid test was negative.  Mother also sick with similar symptoms but she started last Thursday.  Denies difficulty breathing, cough, runny nose, sore throat, high fever, abdominal pain or any other associated symptoms.  No loss of smell or taste. No other complaints or medical concerns today.  Presently on Humira as prescribed by dermatologist.  HPI   Patient Active Problem List   Diagnosis Date Noted  . Irregular periods 12/04/2017    Past Medical History:  Diagnosis Date  . Allergy   . Hydradenitis    on humira per derm    Current Outpatient Medications  Medication Sig Dispense Refill  .  Adalimumab 80 MG/0.8ML PNKT Humira(CF) Pen Crohn's-Ulc Colitis-Hid Sup Strt 80 mg/0.8 mL subcut kt    . clindamycin (CLEOCIN) 300 MG capsule Take 300 mg by mouth 2 (two) times daily.    . cyclobenzaprine (FLEXERIL) 5 MG tablet Take 1 tablet (5 mg total) by mouth 3 (three) times daily as needed for muscle spasms. (Patient not taking: Reported on 12/12/2018) 20 tablet 0  . ibuprofen (ADVIL,MOTRIN) 800 MG tablet Take 1 tablet (800 mg total) by mouth every 8 (eight) hours as needed. 30 tablet 0  . medroxyPROGESTERone (DEPO-PROVERA) 150 MG/ML injection Inject 150 mg into the muscle every 3 (three) months.    . rifampin (RIFADIN) 300 MG capsule Take 300 mg by mouth 2 (two) times daily.     No current facility-administered medications for this visit.    Not on File  Social History   Socioeconomic History  . Marital status: Single    Spouse name: Not on file  . Number of children: Not on file  . Years of education: Not on file  . Highest education level: Not on file  Occupational History  . Not on file  Tobacco Use  . Smoking status: Current Every Day Smoker    Packs/day: 0.50    Years: 1.00    Pack years: 0.50    Types: Cigarettes  . Smokeless tobacco: Never Used  Substance and Sexual Activity  . Alcohol use: Yes  . Drug use: No  . Sexual activity: Yes    Birth control/protection: None  Other Topics Concern  . Not on file  Social History Narrative  Single. Education: [employer].    Social Determinants of Health   Financial Resource Strain: Not on file  Food Insecurity: Not on file  Transportation Needs: Not on file  Physical Activity: Not on file  Stress: Not on file  Social Connections: Not on file  Intimate Partner Violence: Not on file    Review of Systems  Constitutional: Positive for chills. Negative for fever.  HENT: Negative.  Negative for congestion and sore throat.   Respiratory: Negative.  Negative for cough and shortness of breath.   Cardiovascular: Negative.   Negative for chest pain and palpitations.  Gastrointestinal: Positive for diarrhea, nausea and vomiting. Negative for abdominal pain, blood in stool and melena.  Musculoskeletal: Positive for myalgias.  Skin: Negative.  Negative for rash.  Neurological: Positive for headaches.  All other systems reviewed and are negative.   Objective   Vitals as reported by the patient: There were no vitals filed for this visit.  There are no diagnoses linked to this encounter. Clinically stable.  No red flag signs or symptoms.  Patient presently taking Humira probably immunosuppressed.  Treat symptomatically.  Medications as prescribed.  Tylenol and or Advil as needed.  Zofran as needed. ED precautions given.  Covid precautions given.  Advised to contact the office if no better or worse during the next several days. Diagnoses and all orders for this visit:  Intractable vomiting with nausea, unspecified vomiting type -     ondansetron (ZOFRAN ODT) 4 MG disintegrating tablet; Take 1 tablet (4 mg total) by mouth every 8 (eight) hours as needed for nausea or vomiting.  Viral illness     I discussed the assessment and treatment plan with the patient. The patient was provided an opportunity to ask questions and all were answered. The patient agreed with the plan and demonstrated an understanding of the instructions.   The patient was advised to call back or seek an in-person evaluation if the symptoms worsen or if the condition fails to improve as anticipated.  I provided 20 minutes of non-face-to-face time during this encounter.  Georgina Quint, MD  Primary Care at [facility]

## 2020-07-30 DIAGNOSIS — N644 Mastodynia: Secondary | ICD-10-CM | POA: Diagnosis not present

## 2020-07-30 DIAGNOSIS — Z3042 Encounter for surveillance of injectable contraceptive: Secondary | ICD-10-CM | POA: Diagnosis not present

## 2020-10-12 DIAGNOSIS — Z79899 Other long term (current) drug therapy: Secondary | ICD-10-CM | POA: Diagnosis not present

## 2020-10-12 DIAGNOSIS — L732 Hidradenitis suppurativa: Secondary | ICD-10-CM | POA: Diagnosis not present

## 2020-10-22 DIAGNOSIS — Z3042 Encounter for surveillance of injectable contraceptive: Secondary | ICD-10-CM | POA: Diagnosis not present

## 2020-10-30 DIAGNOSIS — L732 Hidradenitis suppurativa: Secondary | ICD-10-CM | POA: Diagnosis not present

## 2020-11-05 DIAGNOSIS — L732 Hidradenitis suppurativa: Secondary | ICD-10-CM | POA: Diagnosis not present

## 2020-11-13 DIAGNOSIS — L732 Hidradenitis suppurativa: Secondary | ICD-10-CM | POA: Diagnosis not present

## 2020-11-20 DIAGNOSIS — L732 Hidradenitis suppurativa: Secondary | ICD-10-CM | POA: Diagnosis not present

## 2020-12-17 DIAGNOSIS — L732 Hidradenitis suppurativa: Secondary | ICD-10-CM | POA: Diagnosis not present

## 2021-01-14 DIAGNOSIS — Z3042 Encounter for surveillance of injectable contraceptive: Secondary | ICD-10-CM | POA: Diagnosis not present

## 2021-02-01 DIAGNOSIS — L910 Hypertrophic scar: Secondary | ICD-10-CM | POA: Diagnosis not present

## 2021-02-01 DIAGNOSIS — Z79899 Other long term (current) drug therapy: Secondary | ICD-10-CM | POA: Diagnosis not present

## 2021-02-01 DIAGNOSIS — L732 Hidradenitis suppurativa: Secondary | ICD-10-CM | POA: Diagnosis not present

## 2021-02-10 DIAGNOSIS — Z01419 Encounter for gynecological examination (general) (routine) without abnormal findings: Secondary | ICD-10-CM | POA: Diagnosis not present

## 2021-02-10 DIAGNOSIS — Z6833 Body mass index (BMI) 33.0-33.9, adult: Secondary | ICD-10-CM | POA: Diagnosis not present

## 2021-02-11 DIAGNOSIS — L732 Hidradenitis suppurativa: Secondary | ICD-10-CM | POA: Diagnosis not present

## 2021-03-25 DIAGNOSIS — L732 Hidradenitis suppurativa: Secondary | ICD-10-CM | POA: Diagnosis not present

## 2021-04-08 DIAGNOSIS — Z3042 Encounter for surveillance of injectable contraceptive: Secondary | ICD-10-CM | POA: Diagnosis not present

## 2021-05-06 DIAGNOSIS — L732 Hidradenitis suppurativa: Secondary | ICD-10-CM | POA: Diagnosis not present

## 2021-06-17 DIAGNOSIS — L732 Hidradenitis suppurativa: Secondary | ICD-10-CM | POA: Diagnosis not present

## 2021-07-01 DIAGNOSIS — Z3042 Encounter for surveillance of injectable contraceptive: Secondary | ICD-10-CM | POA: Diagnosis not present

## 2021-07-19 DIAGNOSIS — Z79899 Other long term (current) drug therapy: Secondary | ICD-10-CM | POA: Diagnosis not present

## 2021-07-19 DIAGNOSIS — L910 Hypertrophic scar: Secondary | ICD-10-CM | POA: Diagnosis not present

## 2021-07-19 DIAGNOSIS — L732 Hidradenitis suppurativa: Secondary | ICD-10-CM | POA: Diagnosis not present

## 2021-07-22 DIAGNOSIS — Z79899 Other long term (current) drug therapy: Secondary | ICD-10-CM | POA: Diagnosis not present

## 2021-07-22 DIAGNOSIS — L910 Hypertrophic scar: Secondary | ICD-10-CM | POA: Diagnosis not present

## 2021-07-22 DIAGNOSIS — L732 Hidradenitis suppurativa: Secondary | ICD-10-CM | POA: Diagnosis not present

## 2021-07-30 DIAGNOSIS — L732 Hidradenitis suppurativa: Secondary | ICD-10-CM | POA: Diagnosis not present

## 2021-08-12 ENCOUNTER — Ambulatory Visit
Admission: EM | Admit: 2021-08-12 | Discharge: 2021-08-12 | Disposition: A | Discharge status: Home / Self Care | Payer: BC Managed Care – PPO | Attending: Family Medicine | Admitting: Family Medicine

## 2021-08-12 DIAGNOSIS — J020 Streptococcal pharyngitis: Secondary | ICD-10-CM | POA: Diagnosis not present

## 2021-08-12 LAB — POCT RAPID STREP A (OFFICE): Rapid Strep A Screen: POSITIVE — AB

## 2021-08-12 MED ORDER — CLINDAMYCIN HCL 300 MG PO CAPS
300.0000 mg | ORAL_CAPSULE | Freq: Three times a day (TID) | ORAL | 0 refills | Status: DC
Start: 2021-08-12 — End: 2021-08-16

## 2021-08-12 MED ORDER — CLINDAMYCIN HCL 300 MG PO CAPS
300.0000 mg | ORAL_CAPSULE | Freq: Three times a day (TID) | ORAL | 0 refills | Status: DC
Start: 2021-08-12 — End: 2021-08-12

## 2021-08-12 NOTE — ED Provider Notes (Signed)
[name]    CSN: [id number] Arrival date & time: 08/12/21  1030      History   Chief Complaint Chief Complaint  Patient presents with   Sore Throat    HPI Jacqueline Kelly is a 31 y.o. female.    Sore Throat  Here for sore throat that she first noticed this morning.  No fever or vomiting  She is on Remicade for Crohn's  She has a supply of clindamycin at home.  Past Medical History:  Diagnosis Date   Allergy    Hydradenitis    on humira per derm    Patient Active Problem List   Diagnosis Date Noted   Irregular periods 12/04/2017    No past surgical history on file.  OB History   No obstetric history on file.      Home Medications    Prior to Admission medications   Medication Sig Start Date End Date Taking? Authorizing Provider  inFLIXimab-axxq (AVSOLA) 100 MG injection Inject into the vein.   Yes [provider]  Adalimumab 80 MG/0.8ML PNKT Humira(CF) Pen Crohn's-Ulc Colitis-Hid Sup Strt 80 mg/0.8 mL subcut kt    [provider]  clindamycin (CLEOCIN) 300 MG capsule Take 1 capsule (300 mg total) by mouth 3 (three) times daily for 10 days. 08/12/21 08/22/21  Zenia Resides, MD  cyclobenzaprine (FLEXERIL) 5 MG tablet Take 1 tablet (5 mg total) by mouth 3 (three) times daily as needed for muscle spasms. Patient not taking: Reported on 12/12/2018 10/26/18   Janeece Agee, NP  ibuprofen (ADVIL,MOTRIN) 800 MG tablet Take 1 tablet (800 mg total) by mouth every 8 (eight) hours as needed. 09/18/13   Weber, Dema Severin, PA-C  medroxyPROGESTERone (DEPO-PROVERA) 150 MG/ML injection Inject 150 mg into the muscle every 3 (three) months.    [provider]  rifampin (RIFADIN) 300 MG capsule Take 300 mg by mouth 2 (two) times daily.    [provider]    Family History Family History  Problem Relation Age of Onset   Heart disease Paternal Grandmother    Cancer Paternal Grandfather    Cancer Paternal Aunt     Social  History Social History   Tobacco Use   Smoking status: Every Day    Packs/day: 0.50    Years: 1.00    Pack years: 0.50    Types: Cigarettes   Smokeless tobacco: Never  Substance Use Topics   Alcohol use: Yes   Drug use: No     Allergies   Patient has no known allergies.   Review of Systems Review of Systems   Physical Exam Triage Vital Signs ED Triage Vitals  Enc Vitals Group     BP 08/12/21 1141 139/81     Pulse Rate 08/12/21 1141 (!) 124     Resp 08/12/21 1141 12     Temp 08/12/21 1141 98.2 F (36.8 C)     Temp Source 08/12/21 1141 Oral     SpO2 08/12/21 1141 96 %     Weight --      Height --      Head Circumference --      Peak Flow --      Pain Score 08/12/21 1139 4     Pain Loc --      Pain Edu? --      Excl. in GC? --    No data found.  Updated Vital Signs BP 139/81   Pulse (!) 124   Temp 98.2  F (36.8 C) (Oral)   Resp 12   SpO2 96%   Visual Acuity Right Eye Distance:   Left Eye Distance:   Bilateral Distance:    Right Eye Near:   Left Eye Near:    Bilateral Near:     Physical Exam Vitals reviewed.  Constitutional:      General: She is not in acute distress.    Appearance: She is not toxic-appearing.  HENT:     Right Ear: Tympanic membrane and ear canal normal.     Left Ear: Tympanic membrane and ear canal normal.     Nose: Nose normal.     Mouth/Throat:     Mouth: Mucous membranes are moist.     Comments: Muscles are 2+ hypertrophied and erythematous, with yellow exudate in the crypts Eyes:     Extraocular Movements: Extraocular movements intact.     Conjunctiva/sclera: Conjunctivae normal.     Pupils: Pupils are equal, round, and reactive to light.  Cardiovascular:     Rate and Rhythm: Normal rate and regular rhythm.     Heart sounds: No murmur heard. Pulmonary:     Effort: Pulmonary effort is normal. No respiratory distress.     Breath sounds: No wheezing, rhonchi or rales.  Musculoskeletal:     Cervical back: Neck supple.   Lymphadenopathy:     Cervical: No cervical adenopathy.  Skin:    Capillary Refill: Capillary refill takes less than 2 seconds.     Coloration: Skin is not jaundiced or pale.  Neurological:     General: No focal deficit present.     Mental Status: She is alert and oriented to person, place, and time.  Psychiatric:        Behavior: Behavior normal.     UC Treatments / Results  Labs (all labs ordered are listed, but only abnormal results are displayed) Labs Reviewed  POCT RAPID STREP A (OFFICE) - Abnormal; Notable for the following components:      Result Value   Rapid Strep A Screen Positive (*)    All other components within normal limits    EKG   Radiology No results found.  Procedures Procedures (including critical care time)  Medications Ordered in UC Medications - No data to display  Initial Impression / Assessment and Plan / UC Course  I have reviewed the triage vital signs and the nursing notes.  Pertinent labs & imaging results that were available during my care of the patient were reviewed by me and considered in my medical decision making (see chart for details).     Strep is positive, and she wants to take the clindamycin she has at home already I will print her a prescription in case she does not have the full 10-day supply Final Clinical Impressions(s) / UC Diagnoses   Final diagnoses:  Strep pharyngitis     Discharge Instructions      Your strep test was positive   Take clindamycin 300 mg--1 capsule 3 times daily for 10 days       ED Prescriptions     Medication Sig Dispense Auth. Provider   clindamycin (CLEOCIN) 300 MG capsule  (Status: Discontinued) Take 1 capsule (300 mg total) by mouth 3 (three) times daily for 10 days. 30 capsule Zenia Resides, MD   clindamycin (CLEOCIN) 300 MG capsule Take 1 capsule (300 mg total) by mouth 3 (three) times daily for 10 days. 30 capsule Zenia Resides, MD      PDMP not  reviewed this  encounter.   Zenia ResidesBanister, Dannisha Eckmann K, MD 08/12/21 [phone]

## 2021-08-12 NOTE — ED Triage Notes (Signed)
Patient presents to Urgent Care with complaints of sore throat since this morning. Patient reports no otc medications.

## 2021-08-12 NOTE — Discharge Instructions (Addendum)
Your strep test was positive   Take clindamycin 300 mg--1 capsule 3 times daily for 10 days

## 2021-08-16 ENCOUNTER — Ambulatory Visit
Admission: EM | Admit: 2021-08-16 | Discharge: 2021-08-16 | Disposition: A | Discharge status: Home / Self Care | Payer: BC Managed Care – PPO | Attending: Internal Medicine | Admitting: Internal Medicine

## 2021-08-16 DIAGNOSIS — J020 Streptococcal pharyngitis: Secondary | ICD-10-CM

## 2021-08-16 MED ORDER — AMOXICILLIN-POT CLAVULANATE 875-125 MG PO TABS: 1.0000 | ORAL_TABLET | Freq: Two times a day (BID) | ORAL | 0 refills | Status: AC

## 2021-08-16 NOTE — ED Triage Notes (Signed)
Pt c/o testing strep(+) here late last week. States provider told her to take clindamycin from home. Has since not helped. Limited abx options per provider.

## 2021-08-16 NOTE — Discharge Instructions (Addendum)
Please stop taking clindamycin Start taking Augmentin as prescribed Chloraseptic throat spray Continue warm salt water gargle Tylenol as needed for pain and/or fever If you have worsening symptoms please return to urgent care to be reevaluated.

## 2021-08-17 NOTE — ED Provider Notes (Signed)
[name]    CSN: [id number] Arrival date & time: 08/16/21  1243      History   Chief Complaint Chief Complaint  Patient presents with   Sore Throat    HPI Jacqueline Kelly is a 31 y.o. female with a history of hidradenitis suppurativa on immunologic's was seen in the urgent care last week for strep throat.  Patient was advised to take clindamycin which she already had at home.  She is taking 5 days worth of clindamycin with no improvement in the symptoms.  She continues to have sore throat with white patches on the tonsils as well as pain on swallowing.  No febrile episodes.  No shortness of breath, cough or sputum production.  No generalized body aches or rash.   HPI  Past Medical History:  Diagnosis Date   Allergy    Hydradenitis    on humira per derm    Patient Active Problem List   Diagnosis Date Noted   Irregular periods 12/04/2017    History reviewed. No pertinent surgical history.  OB History   No obstetric history on file.      Home Medications    Prior to Admission medications   Medication Sig Start Date End Date Taking? Authorizing Provider  amoxicillin-clavulanate (AUGMENTIN) 875-125 MG tablet Take 1 tablet by mouth 2 (two) times daily for 10 days. 08/16/21 08/26/21 Yes Aleksi Brummet, Britta Mccreedy, MD  Adalimumab 80 MG/0.8ML PNKT Humira(CF) Pen Crohn's-Ulc Colitis-Hid Sup Strt 80 mg/0.8 mL subcut kt    [provider]  ibuprofen (ADVIL,MOTRIN) 800 MG tablet Take 1 tablet (800 mg total) by mouth every 8 (eight) hours as needed. 09/18/13   Weber, Sarah L, PA-C  inFLIXimab-axxq (AVSOLA) 100 MG injection Inject into the vein.    [provider]  medroxyPROGESTERone (DEPO-PROVERA) 150 MG/ML injection Inject 150 mg into the muscle every 3 (three) months.    [provider]  rifampin (RIFADIN) 300 MG capsule Take 300 mg by mouth 2 (two) times daily.    [provider]    Family History Family History  Problem Relation Age  of Onset   Heart disease Paternal Grandmother    Cancer Paternal Grandfather    Cancer Paternal Aunt     Social History Social History   Tobacco Use   Smoking status: Every Day    Packs/day: 0.50    Years: 1.00    Pack years: 0.50    Types: Cigarettes   Smokeless tobacco: Never  Substance Use Topics   Alcohol use: Yes   Drug use: No     Allergies   Patient has no known allergies.   Review of Systems Review of Systems  HENT:  Positive for sore throat. Negative for congestion and voice change.   Respiratory: Negative.    Gastrointestinal: Negative.   Genitourinary: Negative.     Physical Exam Triage Vital Signs ED Triage Vitals [08/16/21 1357]  Enc Vitals Group     BP (!) 140/99     Pulse Rate (!) 150     Resp 18     Temp 98.5 F (36.9 C)     Temp Source Oral     SpO2 98 %     Weight      Height      Head Circumference      Peak Flow      Pain Score 0     Pain Loc      Pain Edu?  Excl. in GC?    No data found.  Updated Vital Signs BP (!) 140/99 (BP Location: Left Arm)   Pulse (!) 150   Temp 98.5 F (36.9 C) (Oral)   Resp 18   SpO2 98%   Visual Acuity Right Eye Distance:   Left Eye Distance:   Bilateral Distance:    Right Eye Near:   Left Eye Near:    Bilateral Near:     Physical Exam Vitals and nursing note reviewed.  Constitutional:      General: She is not in acute distress.    Appearance: She is not ill-appearing.  HENT:     Right Ear: Tympanic membrane normal.     Left Ear: Tympanic membrane normal.     Mouth/Throat:     Mouth: Mucous membranes are moist. Mucous membranes are pale.     Tonsils: Tonsillar exudate present. 2+ on the right. 2+ on the left.  Eyes:     Conjunctiva/sclera: Conjunctivae normal.  Cardiovascular:     Rate and Rhythm: Normal rate and regular rhythm.  Pulmonary:     Effort: Pulmonary effort is normal.     Breath sounds: Normal breath sounds.  Neurological:     Mental Status: She is alert.      UC Treatments / Results  Labs (all labs ordered are listed, but only abnormal results are displayed) Labs Reviewed - No data to display  EKG   Radiology No results found.  Procedures Procedures (including critical care time)  Medications Ordered in UC Medications - No data to display  Initial Impression / Assessment and Plan / UC Course  I have reviewed the triage vital signs and the nursing notes.  Pertinent labs & imaging results that were available during my care of the patient were reviewed by me and considered in my medical decision making (see chart for details).     1.  Acute streptococcal pharyngitis: No improvement with clindamycin Augmentin 1 tablet twice daily for 10 days Warm salt water gargle Chloraseptic throat spray Maintain adequate hydration No drug interaction between Augmentin and Avsola Return precautions given. Final Clinical Impressions(s) / UC Diagnoses   Final diagnoses:  Streptococcal sore throat     Discharge Instructions      Please stop taking clindamycin Start taking Augmentin as prescribed Chloraseptic throat spray Continue warm salt water gargle Tylenol as needed for pain and/or fever If you have worsening symptoms please return to urgent care to be reevaluated.   ED Prescriptions     Medication Sig Dispense Auth. Provider   amoxicillin-clavulanate (AUGMENTIN) 875-125 MG tablet Take 1 tablet by mouth 2 (two) times daily for 10 days. 20 tablet Hendrix Yurkovich, Britta Mccreedy, MD      PDMP not reviewed this encounter.   Merrilee Jansky, MD 08/17/21 [phone]

## 2021-09-10 DIAGNOSIS — L732 Hidradenitis suppurativa: Secondary | ICD-10-CM | POA: Diagnosis not present

## 2021-09-23 DIAGNOSIS — Z3042 Encounter for surveillance of injectable contraceptive: Secondary | ICD-10-CM | POA: Diagnosis not present

## 2021-10-22 DIAGNOSIS — L732 Hidradenitis suppurativa: Secondary | ICD-10-CM | POA: Diagnosis not present

## 2021-12-03 DIAGNOSIS — L732 Hidradenitis suppurativa: Secondary | ICD-10-CM | POA: Diagnosis not present

## 2021-12-16 DIAGNOSIS — Z3042 Encounter for surveillance of injectable contraceptive: Secondary | ICD-10-CM | POA: Diagnosis not present

## 2021-12-20 DIAGNOSIS — Z79899 Other long term (current) drug therapy: Secondary | ICD-10-CM | POA: Diagnosis not present

## 2021-12-20 DIAGNOSIS — L732 Hidradenitis suppurativa: Secondary | ICD-10-CM | POA: Diagnosis not present

## 2021-12-20 DIAGNOSIS — R21 Rash and other nonspecific skin eruption: Secondary | ICD-10-CM | POA: Diagnosis not present

## 2022-01-14 DIAGNOSIS — L732 Hidradenitis suppurativa: Secondary | ICD-10-CM | POA: Diagnosis not present

## 2022-02-16 DIAGNOSIS — Z01419 Encounter for gynecological examination (general) (routine) without abnormal findings: Secondary | ICD-10-CM | POA: Diagnosis not present

## 2022-02-16 DIAGNOSIS — Z6834 Body mass index (BMI) 34.0-34.9, adult: Secondary | ICD-10-CM | POA: Diagnosis not present

## 2022-02-16 DIAGNOSIS — Z124 Encounter for screening for malignant neoplasm of cervix: Secondary | ICD-10-CM | POA: Diagnosis not present

## 2022-02-25 DIAGNOSIS — L732 Hidradenitis suppurativa: Secondary | ICD-10-CM | POA: Diagnosis not present

## 2022-03-09 DIAGNOSIS — Z3042 Encounter for surveillance of injectable contraceptive: Secondary | ICD-10-CM | POA: Diagnosis not present

## 2023-10-09 ENCOUNTER — Ambulatory Visit: Admission: EM | Admit: 2023-10-09 | Discharge: 2023-10-09 | Disposition: A | Discharge status: Home / Self Care

## 2023-10-09 DIAGNOSIS — M5441 Lumbago with sciatica, right side: Secondary | ICD-10-CM | POA: Diagnosis not present

## 2023-10-09 MED ORDER — PREDNISONE 20 MG PO TABS
40.0000 mg | ORAL_TABLET | Freq: Every day | ORAL | 0 refills | Status: AC
Start: 2023-10-09 — End: 2023-10-14

## 2023-10-09 MED ORDER — CYCLOBENZAPRINE HCL 10 MG PO TABS: 10.0000 mg | ORAL_TABLET | Freq: Two times a day (BID) | ORAL | 0 refills | Status: AC | PRN

## 2023-10-09 NOTE — ED Provider Notes (Signed)
 [name]    CSN: [id number] Arrival date & time: 10/09/23  1155      History   Chief Complaint Chief Complaint  Patient presents with   Pain    HPI Jacqueline Kelly is a 33 y.o. female.   Patient here today for evaluation of low back pain that started 3 days ago.  She reports that she was unsure if she might of pulled something but denies any known injury.  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  She notes that movement specifically getting in and out of a chair seems to worsen pain.  She does note that right-sided low back pain radiates into right leg.    The history is provided by the patient.    Past Medical History:  Diagnosis Date   Allergy    Hydradenitis    on humira per derm    Patient Active Problem List   Diagnosis Date Noted   Hidradenitis 12/04/2017   Irregular periods 12/04/2017    History reviewed. No pertinent surgical history.  OB History   No obstetric history on file.      Home Medications    Prior to Admission medications   Medication Sig Start Date End Date Taking? Authorizing Provider  adalimumab (HUMIRA) 40 MG/0.4ML pen Inject 40 mg into the skin once a week. 05/04/20  Yes [provider]  clobetasol ointment (TEMOVATE) 0.05 % Apply 1 Application topically 2 (two) times daily.   Yes [provider]  cyclobenzaprine  (FLEXERIL ) 10 MG tablet Take 1 tablet (10 mg total) by mouth 2 (two) times daily as needed for muscle spasms. 10/09/23  Yes Billy Asberry FALCON, PA-C  folic acid (FOLVITE) 1 MG tablet Take 1 mg by mouth daily. 08/09/23  Yes [provider]  ibuprofen  (ADVIL ,MOTRIN ) 800 MG tablet Take 1 tablet (800 mg total) by mouth every 8 (eight) hours as needed. 09/18/13  Yes Weber, Sarah L, PA-C  inFLIXimab -axxq (AVSOLA ) 100 MG injection Inject into the vein.   Yes [provider]  methotrexate (RHEUMATREX) 2.5 MG tablet Take 7.5 mg by mouth once a week. 08/09/23  Yes [provider]  predniSONE  (DELTASONE ) 20 MG tablet Take 2 tablets (40 mg total) by mouth daily with breakfast for 5 days. 10/09/23 10/14/23 Yes Billy Asberry FALCON, PA-C  traMADol  (ULTRAM ) 50 MG tablet Take 50 mg by mouth every 6 (six) hours as needed.   Yes [provider]  medroxyPROGESTERone  (DEPO-PROVERA ) 150 MG/ML injection Inject 150 mg into the muscle every 3 (three) months.    [provider]  rifampin (RIFADIN) 300 MG capsule Take 300 mg by mouth 2 (two) times daily.    [provider]    Family History Family History  Problem Relation Age of Onset   Heart disease Paternal Grandmother    Cancer Paternal Grandfather    Cancer Paternal Aunt     Social History Social History   Tobacco Use   Smoking status: Every Day    Current packs/day: 0.50    Average packs/day: 0.5 packs/day for 1 year (0.5 ttl pk-yrs)    Types: Cigarettes   Smokeless tobacco: Never  Vaping Use   Vaping status: Never Used  Substance Use Topics   Alcohol use: Yes   Drug use: No     Allergies   Patient has no known allergies.   Review of Systems Review of Systems  Constitutional:  Negative for chills and fever.  Eyes:  Negative  for discharge and redness.  Gastrointestinal:  Negative for abdominal pain, nausea and vomiting.  Musculoskeletal:  Positive for back pain and myalgias.     Physical Exam Triage Vital Signs ED Triage Vitals  Encounter Vitals Group     BP 10/09/23 1218 117/84     Girls Systolic BP Percentile --      Girls Diastolic BP Percentile --      Boys Systolic BP Percentile --      Boys Diastolic BP Percentile --      Pulse Rate 10/09/23 1218 (!) 134     Resp 10/09/23 1218 18     Temp 10/09/23 1218 98.1 F (36.7 C)     Temp Source 10/09/23 1218 Oral     SpO2 10/09/23 1218 97 %     Weight 10/09/23 1210 220 lb (99.8 kg)     Height 10/09/23 1210 5' 6 (1.676 m)     Head Circumference --      Peak Flow --      Pain Score 10/09/23 1204 7     Pain  Loc --      Pain Education --      Exclude from Growth Chart --    No data found.  Updated Vital Signs BP 117/84 (BP Location: Left Arm)   Pulse (!) 134   Temp 98.1 F (36.7 C) (Oral)   Resp 18   Ht 5' 6 (1.676 m)   Wt 220 lb (99.8 kg)   LMP  (LMP Unknown)   SpO2 97%   BMI 35.51 kg/m   Visual Acuity Right Eye Distance:   Left Eye Distance:   Bilateral Distance:    Right Eye Near:   Left Eye Near:    Bilateral Near:     Physical Exam Vitals and nursing note reviewed.  Constitutional:      General: She is not in acute distress.    Appearance: Normal appearance. She is not ill-appearing.  HENT:     Head: Normocephalic and atraumatic.  Eyes:     Conjunctiva/sclera: Conjunctivae normal.  Cardiovascular:     Rate and Rhythm: Normal rate.  Pulmonary:     Effort: Pulmonary effort is normal. No respiratory distress.  Musculoskeletal:     Comments: No tenderness to palpation noted to the midline spine, no TTP across low back  Neurological:     Mental Status: She is alert.  Psychiatric:        Mood and Affect: Mood normal.        Behavior: Behavior normal.        Thought Content: Thought content normal.      UC Treatments / Results  Labs (all labs ordered are listed, but only abnormal results are displayed) Labs Reviewed - No data to display  EKG   Radiology No results found.  Procedures Procedures (including critical care time)  Medications Ordered in UC Medications - No data to display  Initial Impression / Assessment and Plan / UC Course  I have reviewed the triage vital signs and the nursing notes.  Pertinent labs & imaging results that were available during my care of the patient were reviewed by me and considered in my medical decision making (see chart for details).     Suspect likely sciatica related pain.  Will treat with muscle relaxer and steroid burst.  Patient reports that steroids tend to elevate her heart rate, and I advised that she  hold the prednisone  and trial an OTC anti-inflammatory along with  muscle relaxer and if no improvement and she has to take prednisone  the prescription will be available.  Encouraged follow-up if no gradual improvement with any worsening symptoms.   Final Clinical Impressions(s) / UC Diagnoses   Final diagnoses:  Acute right-sided low back pain with right-sided sciatica   Discharge Instructions   None    ED Prescriptions     Medication Sig Dispense Auth. Provider   predniSONE  (DELTASONE ) 20 MG tablet Take 2 tablets (40 mg total) by mouth daily with breakfast for 5 days. 10 tablet Billy Stabs F, PA-C   cyclobenzaprine  (FLEXERIL ) 10 MG tablet Take 1 tablet (10 mg total) by mouth 2 (two) times daily as needed for muscle spasms. 20 tablet Billy Stabs FALCON, PA-C      PDMP not reviewed this encounter.   Billy Stabs FALCON, PA-C 10/09/23 1425

## 2023-10-09 NOTE — ED Triage Notes (Signed)
 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 No obvious injury known.

## 2023-10-21 ENCOUNTER — Encounter: Payer: Self-pay | Admitting: Emergency Medicine

## 2023-10-21 ENCOUNTER — Ambulatory Visit
Admission: EM | Admit: 2023-10-21 | Discharge: 2023-10-21 | Disposition: A | Discharge status: Home / Self Care | Attending: Emergency Medicine | Admitting: Emergency Medicine

## 2023-10-21 DIAGNOSIS — M5431 Sciatica, right side: Secondary | ICD-10-CM | POA: Diagnosis not present

## 2023-10-21 MED ORDER — LIDOCAINE 5 % EX PTCH
1.0000 | MEDICATED_PATCH | CUTANEOUS | 0 refills | Status: AC
Start: 2023-10-21 — End: ?

## 2023-10-21 MED ORDER — DICLOFENAC SODIUM 1 % EX GEL: 2.0000 g | Freq: Four times a day (QID) | CUTANEOUS | 0 refills | Status: AC

## 2023-10-21 MED ORDER — KETOROLAC TROMETHAMINE 30 MG/ML IJ SOLN
30.0000 mg | Freq: Once | INTRAMUSCULAR | Status: AC
  Administered 2023-10-21: 30 mg via INTRAMUSCULAR

## 2023-10-21 NOTE — ED Triage Notes (Addendum)
 Pt presents c/o back pain x 15 days. Pt reports she was given Flexeril  at last UC visit and she has finished the bottle. Pt reports it didn't help with sxs at all. Pt believes this is sciatic pain.

## 2023-10-21 NOTE — Discharge Instructions (Signed)
 You were seen in urgent care today for concerns of back pain.  Your symptoms are consistent with sciatic pain.  Ideally, you would benefit from use of anti-inflammatory medication such as ibuprofen  or naproxen .  I cannot find any drug interactions between your home medications and these medications but if you have previously told not take these, I would caution against using these.  I am sending you home with a couple other medications to try to use topically.  Try to also continue to gently stretch the right leg/low back.  The symptoms typically last for about 4 to 6 weeks.  If her symptoms not improving at that time, he would likely require further evaluation with advanced imaging such as MRI.  Please follow-up with your primary care provider for further evaluation.

## 2023-10-21 NOTE — ED Provider Notes (Signed)
 [name]    CSN: [id number] Arrival date & time: 10/21/23  9060      History   Chief Complaint Chief Complaint  Patient presents with   Back Pain    HPI Jacqueline Kelly is a 33 y.o. female.  Patient presents to urgent care with concerns of back pain.  Reports back pain has been ongoing for last 2 weeks.  States that she was given Flexeril  and prednisone  at her last visit and states that this did not help her symptoms.  Denies any bowel or bladder incontinence, unilateral weakness or numbness.  States that she is having hard time getting comfortable in a position.  No reported urinary symptoms such as dysuria, hematuria, increased urinary frequency or urgency.  No reported flank pain.   Back Pain   Past Medical History:  Diagnosis Date   Allergy    Hydradenitis    on humira per derm    Patient Active Problem List   Diagnosis Date Noted   Hidradenitis 12/04/2017   Irregular periods 12/04/2017    History reviewed. No pertinent surgical history.  OB History   No obstetric history on file.      Home Medications    Prior to Admission medications   Medication Sig Start Date End Date Taking? Authorizing Provider  adalimumab (HUMIRA) 40 MG/0.4ML pen Inject 40 mg into the skin once a week. 05/04/20   [provider]  clobetasol ointment (TEMOVATE) 0.05 % Apply 1 Application topically 2 (two) times daily.    [provider]  cyclobenzaprine  (FLEXERIL ) 10 MG tablet Take 1 tablet (10 mg total) by mouth 2 (two) times daily as needed for muscle spasms. 10/09/23   Billy Asberry FALCON, PA-C  diclofenac  Sodium (VOLTAREN ) 1 % GEL Apply 2 g topically 4 (four) times daily. 10/21/23  Yes Westlee Devita A, PA-C  folic acid (FOLVITE) 1 MG tablet Take 1 mg by mouth daily. 08/09/23   [provider]  ibuprofen  (ADVIL ,MOTRIN ) 800 MG tablet Take 1 tablet (800 mg total) by mouth every 8 (eight) hours as needed. 09/18/13   Weber, Lauraine CROME, PA-C  inFLIXimab -axxq  (AVSOLA ) 100 MG injection Inject into the vein.    [provider]  lidocaine  (LIDODERM ) 5 % Place 1 patch onto the skin daily. Remove & Discard patch within 12 hours or as directed by MD 10/21/23  Yes Vaness Jelinski A, PA-C  medroxyPROGESTERone  (DEPO-PROVERA ) 150 MG/ML injection Inject 150 mg into the muscle every 3 (three) months.    [provider]  methotrexate (RHEUMATREX) 2.5 MG tablet Take 7.5 mg by mouth once a week. 08/09/23   [provider]  rifampin (RIFADIN) 300 MG capsule Take 300 mg by mouth 2 (two) times daily.    [provider]  traMADol  (ULTRAM ) 50 MG tablet Take 50 mg by mouth every 6 (six) hours as needed.    [provider]    Family History Family History  Problem Relation Age of Onset   Heart disease Paternal Grandmother    Cancer Paternal Grandfather    Cancer Paternal Aunt     Social History Social History   Tobacco Use   Smoking status: Every Day    Current packs/day: 0.50    Average packs/day: 0.5 packs/day for 1 year (0.5 ttl pk-yrs)    Types: Cigarettes    Passive exposure: Current   Smokeless tobacco: Never  Vaping Use   Vaping status: Never Used  Substance Use Topics   Alcohol use: Yes  Drug use: No     Allergies   Patient has no known allergies.   Review of Systems Review of Systems  Musculoskeletal:  Positive for back pain.  All other systems reviewed and are negative.    Physical Exam Triage Vital Signs ED Triage Vitals  Encounter Vitals Group     BP 10/21/23 1001 112/81     Girls Systolic BP Percentile --      Girls Diastolic BP Percentile --      Boys Systolic BP Percentile --      Boys Diastolic BP Percentile --      Pulse Rate 10/21/23 1001 (!) 137     Resp 10/21/23 1001 20     Temp 10/21/23 1001 98 F (36.7 C)     Temp Source 10/21/23 1001 Oral     SpO2 10/21/23 1001 98 %     Weight 10/21/23 1000 220 lb 0.3 oz (99.8 kg)     Height --      Head Circumference --      Peak  Flow --      Pain Score 10/21/23 0957 8     Pain Loc --      Pain Education --      Exclude from Growth Chart --    No data found.  Updated Vital Signs BP 112/81 (BP Location: Left Arm)   Pulse (!) 137   Temp 98 F (36.7 C) (Oral)   Resp 20   Wt 220 lb 0.3 oz (99.8 kg)   LMP  (LMP Unknown)   SpO2 98%   BMI 35.51 kg/m   Visual Acuity Right Eye Distance:   Left Eye Distance:   Bilateral Distance:    Right Eye Near:   Left Eye Near:    Bilateral Near:     Physical Exam Vitals and nursing note reviewed.  Constitutional:      General: She is not in acute distress.    Appearance: She is well-developed.  HENT:     Head: Normocephalic and atraumatic.  Eyes:     Conjunctiva/sclera: Conjunctivae normal.  Cardiovascular:     Rate and Rhythm: Normal rate and regular rhythm.     Heart sounds: No murmur heard. Pulmonary:     Effort: Pulmonary effort is normal. No respiratory distress.     Breath sounds: Normal breath sounds.  Abdominal:     Palpations: Abdomen is soft.     Tenderness: There is no abdominal tenderness.  Musculoskeletal:        General: Tenderness present. No swelling, deformity or signs of injury.     Cervical back: Neck supple.     Comments: Positive straight leg raise on the right side.  Tenderness to palpation in the deep right gluteal muscles.  Dorsiflexion of the foot worsens radiating symptoms in the right lower extremity.  Skin:    General: Skin is warm and dry.     Capillary Refill: Capillary refill takes less than 2 seconds.  Neurological:     Mental Status: She is alert.     Motor: No weakness.     Comments: DTRs 2+ bilaterally in the lower extremities.  Psychiatric:        Mood and Affect: Mood normal.      UC Treatments / Results  Labs (all labs ordered are listed, but only abnormal results are displayed) Labs Reviewed - No data to display  EKG   Radiology No results found.  Procedures Procedures (including critical care  time)  Medications Ordered in UC Medications  ketorolac  (TORADOL ) 30 MG/ML injection 30 mg (has no administration in time range)    Initial Impression / Assessment and Plan / UC Course  I have reviewed the triage vital signs and the nursing notes.  Pertinent labs & imaging results that were available during my care of the patient were reviewed by me and considered in my medical decision making (see chart for details).   This patient presents to the UC for concern of low back pain.  Differential diagnosis includes sciatica, cauda equina, lumbar strain, piriformis syndrome   Medicines ordered and prescription drug management:  I ordered medication including Toradol  for pain Reevaluation of the patient after these medicines showed that the patient improved I have reviewed the patients home medicines and have made adjustments as needed   Problem List /UC course:  Patient without known medical history presents to the urgent care today for concerns of back pain.  Was seen initially about 2 weeks ago for concerns of sciatic type pain.  Reportedly was given prednisone  Flexeril  and denies improvement in symptoms.  Denies any significant change in symptoms but states that her symptoms are not improving.  No reported bowel or bladder incontinence. On exam, patient is well-appearing.  There is tenderness palpation at the deep right hip.  Positive straight leg raise.  Worsening pain with dorsiflexion.  Symptoms are consistent with sciatic pain.  Exam is nonconcerning for cauda equina syndrome. Will give a dose of Toradol  IM here in the urgent care.  Advised patient to follow-up with PCP for further assessment.  Also advised symptoms typically can last for around 4 to 6 weeks and typical cases.  Encouraged patient to seek further care if her symptoms are progressively worsening.  Will discharge home with Voltaren  and lidocaine  patches.  Discharged home in stable condition.   Social Determinants of  Health:  None  Final Clinical Impressions(s) / UC Diagnoses   Final diagnoses:  Sciatica of right side     Discharge Instructions      You were seen in urgent care today for concerns of back pain.  Your symptoms are consistent with sciatic pain.  Ideally, you would benefit from use of anti-inflammatory medication such as ibuprofen  or naproxen .  I cannot find any drug interactions between your home medications and these medications but if you have previously told not take these, I would caution against using these.  I am sending you home with a couple other medications to try to use topically.  Try to also continue to gently stretch the right leg/low back.  The symptoms typically last for about 4 to 6 weeks.  If her symptoms not improving at that time, he would likely require further evaluation with advanced imaging such as MRI.  Please follow-up with your primary care provider for further evaluation.     ED Prescriptions     Medication Sig Dispense Auth. Provider   diclofenac  Sodium (VOLTAREN ) 1 % GEL Apply 2 g topically 4 (four) times daily. 50 g Darshana Curnutt A, PA-C   lidocaine  (LIDODERM ) 5 % Place 1 patch onto the skin daily. Remove & Discard patch within 12 hours or as directed by MD 30 patch Holley Kocurek, [place] [facility], PA-C      PDMP not reviewed this encounter.   Cindra Austad A, PA-C 10/21/23 1024
